# Patient Record
Sex: Female | Born: 2000
Health system: Southern US, Community
[De-identification: ages and names within clinical notes are randomized; demographics above are authoritative.]

## PROBLEM LIST (undated history)

## (undated) DIAGNOSIS — Z00129 Encounter for routine child health examination without abnormal findings: Principal | ICD-10-CM

## (undated) DIAGNOSIS — B084 Enteroviral vesicular stomatitis with exanthem: Secondary | ICD-10-CM

## (undated) DIAGNOSIS — T7840XA Allergy, unspecified, initial encounter: Secondary | ICD-10-CM

## (undated) DIAGNOSIS — L559 Sunburn, unspecified: Secondary | ICD-10-CM

## (undated) DIAGNOSIS — J45909 Unspecified asthma, uncomplicated: Secondary | ICD-10-CM

## (undated) DIAGNOSIS — D649 Anemia, unspecified: Secondary | ICD-10-CM

## (undated) DIAGNOSIS — F419 Anxiety disorder, unspecified: Secondary | ICD-10-CM

## (undated) HISTORY — PX: KIDNEY SURGERY: SHX687

## (undated) HISTORY — DX: Allergy, unspecified, initial encounter: T78.40XA

## (undated) HISTORY — DX: Anxiety disorder, unspecified: F41.9

## (undated) HISTORY — DX: Enteroviral vesicular stomatitis with exanthem: B08.4

## (undated) HISTORY — DX: Anemia, unspecified: D64.9

## (undated) HISTORY — PX: ENDOSCOPIC INJECTION OF DEFLUX: SHX1505

## (undated) HISTORY — DX: Encounter for routine child health examination without abnormal findings: Z00.129

## (undated) HISTORY — DX: Sunburn, unspecified: L55.9

## (undated) HISTORY — DX: Unspecified asthma, uncomplicated: J45.909

---
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
## 2022-01-05 DIAGNOSIS — O26893 Other specified pregnancy related conditions, third trimester: Secondary | ICD-10-CM | POA: Diagnosis not present

## 2022-01-05 DIAGNOSIS — N898 Other specified noninflammatory disorders of vagina: Secondary | ICD-10-CM | POA: Diagnosis not present

## 2022-01-05 DIAGNOSIS — Z3A35 35 weeks gestation of pregnancy: Secondary | ICD-10-CM | POA: Diagnosis not present

## 2022-01-21 DIAGNOSIS — O471 False labor at or after 37 completed weeks of gestation: Secondary | ICD-10-CM | POA: Diagnosis not present

## 2022-01-21 DIAGNOSIS — Z3A38 38 weeks gestation of pregnancy: Secondary | ICD-10-CM | POA: Diagnosis not present

## 2022-01-29 DIAGNOSIS — O9902 Anemia complicating childbirth: Secondary | ICD-10-CM | POA: Diagnosis not present

## 2022-01-29 DIAGNOSIS — Z91018 Allergy to other foods: Secondary | ICD-10-CM | POA: Diagnosis not present

## 2022-01-29 DIAGNOSIS — Z882 Allergy status to sulfonamides status: Secondary | ICD-10-CM | POA: Diagnosis not present

## 2022-01-29 DIAGNOSIS — Z3A39 39 weeks gestation of pregnancy: Secondary | ICD-10-CM | POA: Diagnosis not present

## 2022-01-29 DIAGNOSIS — Z880 Allergy status to penicillin: Secondary | ICD-10-CM | POA: Diagnosis not present

## 2022-04-17 ENCOUNTER — Encounter: Payer: Self-pay | Admitting: Family Medicine

## 2022-04-17 ENCOUNTER — Ambulatory Visit (INDEPENDENT_AMBULATORY_CARE_PROVIDER_SITE_OTHER): Payer: No Typology Code available for payment source | Admitting: Family Medicine

## 2022-04-17 VITALS — BP 123/84 | HR 74 | Ht 68.5 in | Wt 174.6 lb

## 2022-04-17 DIAGNOSIS — D509 Iron deficiency anemia, unspecified: Secondary | ICD-10-CM

## 2022-04-17 DIAGNOSIS — Z8279 Family history of other congenital malformations, deformations and chromosomal abnormalities: Secondary | ICD-10-CM | POA: Diagnosis not present

## 2022-04-17 NOTE — Patient Instructions (Signed)
Thank you for choosing [facility] for your Primary Care needs. I am excited for the opportunity to partner with you to meet your health care goals. It was a pleasure meeting you today! ? ? ?Information on diet, exercise, and health maintenance recommendations are listed below. This is information to help you be sure you are on track for optimal health and monitoring.  ? ?Please look over this and let us know if you have any questions or if you have completed any of the health maintenance outside of [facility] so that we can be sure your records are up to date.  ?___________________________________________________________ ? ?MyChart:  ?For all urgent or time sensitive needs we ask that you please call the office to avoid delays. Our number is [phone]. ?MyChart is not constantly monitored and due to the large volume of messages a day, replies may take up to 72 business hours. ? ?MyChart Policy: ?MyChart allows for you to see your visit notes, after visit summary, provider recommendations, lab and tests results, make an appointment, request refills, and contact your provider or the office for non-urgent questions or concerns. Providers are seeing patients during normal business hours and do not have built in time to review MyChart messages.  ?We ask that you allow a minimum of 3 business days for responses to MyChart messages. For this reason, please do not send urgent requests through MyChart. Please call the office at [phone]. ?New and ongoing conditions may require a visit. We have virtual and in-person visits available for your convenience.  ?Complex MyChart concerns may require a visit. Your provider may request you schedule a virtual or in-person visit to ensure we are providing the best care possible. ?MyChart messages sent after 11:00 AM on Friday will not be received by the provider until Monday morning.  ?  ?Lab and Test Results: ?You will receive your lab and  test results on MyChart as soon as they are completed and results have been sent by the lab or testing facility. Due to this service, you will receive your results BEFORE your provider.  ?I review lab and test results each morning prior to seeing patients. Some results require collaboration with other providers to ensure you are receiving the most appropriate care. For this reason, we ask that you please allow a minimum of 3-5 business days from the time that ALL results have been received for your provider to receive and review lab and test results and contact you about these.  ?Most lab and test result comments from the provider will be sent through MyChart. Your provider may recommend changes to the plan of care, follow-up visits, repeat testing, ask questions, or request an office visit to discuss these results. You may reply directly to this message or call the office to provide information for the provider or set up an appointment. ?In some instances, you will be called with test results and recommendations. Please let us know if this is preferred and we will make note of this in your chart to provide this for you.    ?If you have not heard a response to your lab or test results in 5 business days from all results returning to MyChart, please call the office to let us know. We ask that you please avoid calling prior to this time unless there is an emergent concern. Due to high call volumes, this can delay the resulting process. ? ?After Hours: ?For all non-emergency after hours needs,   please call the office at [phone] and select the option to reach the on-call  service. On-call services are shared between multiple [place] offices and therefore it will not be possible to speak directly with your provider. On-call providers may provide medical advice and recommendations, but are unable to provide refills for maintenance medications.  ?For all emergency or urgent medical needs after normal business  hours, we recommend that you seek care at the closest Urgent Care or Emergency Department to ensure appropriate treatment in a timely manner.  ?[facility] has a 24 hour emergency room located on the ground floor for your convenience.  ? ?Urgent Concerns During the Business Day ?Providers are seeing patients from 8AM to 5PM with a busy schedule and are most often not able to respond to non-urgent calls until the end of the day or the next business day. ?If you should have URGENT concerns during the day, please call and speak to the nurse or schedule a same day appointment so that we can address your concern without delay.  ? ?Thank you, again, for choosing me as your health care partner. I appreciate your trust and look forward to learning more about you.  ? ?Yarnell Arvidson B. Caris Cerveny, DNP, FNP-C ? ?___________________________________________________________ ? ?Health Maintenance Recommendations ?Screening Testing ?Mammogram ?Every 1-2 years based on history and risk factors ?Starting at age 50 ?Pap Smear ?Ages 21-39 every 3 years ?Ages 30-65 every 5 years with HPV testing ?More frequent testing may be required based on results and history ?Colon Cancer Screening ?Every 1-10 years based on test performed, risk factors, and history ?Starting at age 45 ?Bone Density Screening ?Every 2-10 years based on history ?Starting at age 65 for women ?Recommendations for men differ based on medication usage, history, and risk factors ?AAA Screening ?One time ultrasound ?Men 65-75 years old who have ever smoked ?Lung Cancer Screening ?Low Dose Lung CT every 12 months ?Age 50-80 years with a 20 pack-year smoking history who still smoke or who have quit within the last 15 years ? ?Screening Labs ?Routine  Labs: Complete Blood Count (CBC), Complete Metabolic Panel (CMP), Cholesterol (Lipid Panel) ?Every 6-12 months based on history and medications ?May be recommended more frequently based on current conditions or  previous results ?Hemoglobin A1c Lab ?Every 3-12 months based on history and previous results ?Starting at age 45 or earlier with diagnosis of diabetes, high cholesterol, BMI >26, and/or risk factors ?Frequent monitoring for patients with diabetes to ensure blood sugar control ?Thyroid Panel (TSH w/ T3 & T4) ?Every 6 months based on history, symptoms, and risk factors ?May be repeated more often if on medication ?HIV ?One time testing for all patients 13 and older ?May be repeated more frequently for patients with increased risk factors or exposure ?Hepatitis C ?One time testing for all patients 18 and older ?May be repeated more frequently for patients with increased risk factors or exposure ?Gonorrhea, Chlamydia ?Every 12 months for all sexually active persons 13-24 years ?Additional monitoring may be recommended for those who are considered high risk or who have symptoms ?PSA ?Men 40-54 years old with risk factors ?Additional screening may be recommended from age 55-69 based on risk factors, symptoms, and history ? ?Vaccine Recommendations ?Tetanus Booster ?All adults every 10 years ?Flu Vaccine ?All patients 6 months and older every year ?COVID Vaccine ?All patients 12 years and older ?Initial dosing with booster ?May recommend additional booster based on age and health history ?HPV Vaccine ?2 doses all patients   age 9-26 ?Dosing may be considered for patients over 26 ?Shingles Vaccine (Shingrix) ?2 doses all adults 50 years and older ?Pneumonia (Pneumovax 23) ?All adults 65 years and older ?May recommend earlier dosing based on health history ?Pneumonia (Prevnar 13) ?All adults 65 years and older ?Dosed 1 year after Pneumovax 23 ?Pneumonia (Prevnar 20) ?All adults 65 years and older (adults 19-64 with certain conditions or risk factors) ?1 dose  ?For those who have no received Prevnar 13 vaccine previously ? ? ?Additional Screening, Testing, and Vaccinations may be recommended on an individualized basis based on  family history, health history, risk factors, and/or exposure.  ?__________________________________________________________ ? ?Diet Recommendations for All Patients ? ?I recommend that all patients maintain a diet

## 2022-04-17 NOTE — Progress Notes (Signed)
? ?New Patient Office Visit ? ?Subjective   ? ?Patient ID: Erica Novak, female    DOB: [birth year]  Age: 21 y.o. MRN: [id number] ? ?CC: establish care ? ? ?HPI ?Erica Novak presents to establish care ? ?Just had a baby boy 11 weeks ago - doing really well overall. Lives with boyfriend and grandfather. 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   ? ? ?She was having some ear pain for about a week with history of perforated ear drum as a child, but has not had any problems recently. Gets an ear infection maybe once or twice a year. Symptoms have been fully resolved for the past 5+ days now. Would like me to check her ears today. ? ?Found out 2 weeks ago that grandfather's side of the family had a heart problem at birth (most relatives had heart surgery to correct the problem). Reports it was a "hole in the heart that never closed" - she says ASD sounds familiar. States she has never been told she had a murmur and has never had any cardiac/respiratory symptoms. States she does occasionally have episodes of lightheadedness, but thinks this may be related to her IDA. She wanted to see if this was anything she should worry about.  ? ?States she has mild IDA and has been recommended to take supplementation, but she has not been consistent because of the GI side effects of iron.  ? ? ? ?Outpatient Encounter Medications as of 04/17/2022  ?Medication Sig  ? [DISCONTINUED] norethindrone-ethinyl estradiol (LOESTRIN FE) 1-20 MG-MCG tablet Take 1 tablet by mouth daily.  ? [DISCONTINUED] omeprazole (PRILOSEC) 20 MG capsule Take 1 capsule (20 mg total) by mouth daily.  ? [DISCONTINUED] sucralfate (CARAFATE) 1 g tablet Take 1 tablet (1 g total) by mouth 4 (four) times daily as needed.  ? ?No facility-administered encounter medications on file as of 04/17/2022.  ? ? ?Past Medical History:  ?Diagnosis Date  ? Allergy   ? Anemia   ? Anxiety   ? Asthma, chronic 02/05/2016  ? Hand, foot and mouth disease   ? has  had in past  ? Sunburn 05/30/2012  ? [place] (well child check) 05/30/2012  ? ? ?Past Surgical History:  ?Procedure Laterality Date  ? ENDOSCOPIC INJECTION OF DEFLUX  21 yrs old  ? [place] SURGERY    ? [place]  ? ? ?Family History  ?Problem Relation Age of Onset  ? Hyperlipidemia Mother   ? Cancer Maternal Grandfather 33  ?     lung, brain, abdomen  ? Hypertension Maternal Grandfather   ? ? ?Social History  ? ?Socioeconomic History  ? Marital status: Single  ?  Spouse name: Not on file  ? Number of children: Not on file  ? Years of education: Not on file  ? Highest education level: Not on file  ?Occupational History  ? Not on file  ?Tobacco Use  ? Smoking status: Never  ? Smokeless tobacco: Never  ?Vaping Use  ? Vaping Use: Never used  ?Substance and Sexual Activity  ? Alcohol use: Yes  ?  Alcohol/week: 2.0 - 3.0 standard drinks  ?  Types: 2 - 3 Glasses of wine per week  ? Drug use: No  ? Sexual activity: Yes  ?  Birth control/protection: Condom  ?Other Topics Concern  ? Not on file  ?Social History Narrative  ? Not on file  ? ?Social Determinants of Health  ? ?Financial Resource Strain: Not on  file  ?Food Insecurity: Not on file  ?Transportation Needs: Not on file  ?Physical Activity: Not on file  ?Stress: Not on file  ?Social Connections: Not on file  ?Intimate Partner Violence: Not on file  ? ? ?ROS ?All review of systems negative except what is listed in the HPI ? ?  ? ? ?Objective   ? ?BP 123/84   Pulse 74   Ht 5' 8.5" (1.74 m)   Wt 174 lb 9.6 oz (79.2 kg)   LMP 04/30/2021   Breastfeeding No   BMI 26.16 kg/m?  ? ?Physical Exam ?Vitals reviewed.  ?Constitutional:   ?   Appearance: Normal appearance. She is normal weight.  ?HENT:  ?   Head: Normocephalic and atraumatic.  ?   Right Ear: Tympanic membrane normal.  ?   Left Ear: Tympanic membrane normal.  ?Cardiovascular:  ?   Rate and Rhythm: Normal rate and regular rhythm.  ?   Pulses: Normal pulses.  ?   Heart sounds: Normal heart sounds. No murmur heard. ?  No  friction rub.  ?Pulmonary:  ?   Effort: Pulmonary effort is normal.  ?   Breath sounds: Normal breath sounds.  ?Musculoskeletal:  ?   Right lower leg: No edema.  ?   Left lower leg: No edema.  ?Skin: ?   General: Skin is warm and dry.  ?Neurological:  ?   General: No focal deficit present.  ?   Mental Status: She is alert and oriented to person, place, and time. Mental status is at baseline.  ?Psychiatric:     ?   Mood and Affect: Mood normal.     ?   Behavior: Behavior normal.     ?   Thought Content: Thought content normal.     ?   Judgment: Judgment normal.  ? ? ? ? ? ? ?  ? ?Assessment & Plan:  ? ?1. Iron deficiency anemia, unspecified iron deficiency anemia type ?No symptoms currently other than occasional lightheadedness. Recommend she stay hydrated, eat a diet rich in iron, try to take supplementation. Checking labs today.  ?- CBC ?- IBC + Ferritin ? ?2. Family history of congenital heart defect ?Offered options of work up including ordering echo vs referring to cardiology. Declines any orders at this time - states she will think about it and let us know. Patient denies any chest pain, palpitations, dyspnea, wheezing, edema, recurrent headaches, vision changes, syncope.  ? ? ? ? ?Return in about 1 year (around 04/18/2023) for physical .  ? ?Terrilyn Saver, NP ? ? ?

## 2022-04-18 ENCOUNTER — Encounter: Payer: Self-pay | Admitting: Family Medicine

## 2022-04-18 DIAGNOSIS — Z8279 Family history of other congenital malformations, deformations and chromosomal abnormalities: Secondary | ICD-10-CM | POA: Insufficient documentation

## 2022-04-18 DIAGNOSIS — D509 Iron deficiency anemia, unspecified: Secondary | ICD-10-CM | POA: Insufficient documentation

## 2022-04-18 LAB — CBC
HCT: 38.6 % (ref 36.0–49.0)
Hemoglobin: 12.5 g/dL (ref 12.0–16.0)
MCHC: 32.5 g/dL (ref 31.0–37.0)
MCV: 85.3 fl (ref 78.0–98.0)
Platelets: 254 10*3/uL (ref 150.0–575.0)
RBC: 4.52 Mil/uL (ref 3.80–5.70)
RDW: 16.6 % — ABNORMAL HIGH (ref 11.4–15.5)
WBC: 7.9 10*3/uL (ref 4.5–13.5)

## 2022-04-18 LAB — IBC + FERRITIN
Ferritin: 8.7 ng/mL — ABNORMAL LOW (ref 10.0–291.0)
Iron: 33 ug/dL — ABNORMAL LOW (ref 42–145)
Saturation Ratios: 7.7 % — ABNORMAL LOW (ref 20.0–50.0)
TIBC: 428.4 ug/dL (ref 250.0–450.0)
Transferrin: 306 mg/dL (ref 212.0–360.0)

## 2022-06-04 ENCOUNTER — Encounter: Payer: Self-pay | Admitting: Family Medicine

## 2022-06-04 ENCOUNTER — Ambulatory Visit (INDEPENDENT_AMBULATORY_CARE_PROVIDER_SITE_OTHER): Payer: No Typology Code available for payment source | Admitting: Family Medicine

## 2022-06-04 VITALS — BP 117/63 | HR 94 | Ht 68.0 in | Wt 179.4 lb

## 2022-06-04 DIAGNOSIS — S61210A Laceration without foreign body of right index finger without damage to nail, initial encounter: Secondary | ICD-10-CM | POA: Diagnosis not present

## 2022-06-04 NOTE — Progress Notes (Signed)
   Acute Office Visit  Subjective:     Patient ID: AVERLEE SWARTZ, female    DOB: 08/29/2001, 21 y.o.   MRN: [id number]  CC: finger cut   HPI Patient is in today for cut to right index finger.  Patient reports that Friday evening she was washing a knife when it slipped and cut across her index finger at PIP. States it stopped bleeding relatively quick and she was able to clean it well and keep it covered. Pain has improved, but is still tender to the touch, mildly swollen and painful to bend. Reports most recent Tdap was last Fall during pregnancy. She denies any redness, warmth, drainage, numbness, tingling.    ROS All review of systems negative except what is listed in the HPI      Objective:    BP 117/63   Pulse 94   Ht 5\' 8"  (1.727 m)   Wt 179 lb 6.4 oz (81.4 kg)   BMI 27.28 kg/m    Physical Exam Vitals reviewed.  Constitutional:      Appearance: Normal appearance.  Musculoskeletal:     Comments: Right 1st finger PIP with mild edema, and healing laceration. No erythema, warmth, drainage (see picture), impaired flexion due to pain/edema, normal sensation and cap refill   Skin:    General: Skin is warm and dry.     Findings: No erythema.  Neurological:     General: No focal deficit present.     Mental Status: She is alert and oriented to person, place, and time. Mental status is at baseline.  Psychiatric:        Mood and Affect: Mood normal.        Behavior: Behavior normal.        Thought Content: Thought content normal.        Judgment: Judgment normal.        No results found for any visits on 06/04/22.      Assessment & Plan:   1. Laceration of right index finger without foreign body without damage to nail, initial encounter No alarm findings on exam. Laceration healing nicely. No signs of infection. Recommend ice, NSAIDs, rest for inflammation/pain followed by ROM stretches as tolerated. Patient aware of signs/symptoms requiring further/urgent  evaluation.    Return if symptoms worsen or fail to improve.  08/05/22, NP

## 2022-06-22 ENCOUNTER — Other Ambulatory Visit (HOSPITAL_BASED_OUTPATIENT_CLINIC_OR_DEPARTMENT_OTHER): Payer: Self-pay

## 2022-06-22 ENCOUNTER — Ambulatory Visit (INDEPENDENT_AMBULATORY_CARE_PROVIDER_SITE_OTHER): Payer: No Typology Code available for payment source | Admitting: Family

## 2022-06-22 VITALS — BP 112/72 | HR 97 | Temp 97.8°F | Resp 18 | Ht 69.0 in | Wt 175.6 lb

## 2022-06-22 DIAGNOSIS — L309 Dermatitis, unspecified: Secondary | ICD-10-CM

## 2022-06-22 MED ORDER — TRIAMCINOLONE ACETONIDE 0.1 % EX CREA
1.0000 | TOPICAL_CREAM | Freq: Two times a day (BID) | CUTANEOUS | 0 refills | Status: DC
  Filled 2022-06-22: qty 30, 15d supply, fill #0

## 2022-06-22 MED ORDER — DOXYCYCLINE HYCLATE 100 MG PO TABS
100.0000 mg | ORAL_TABLET | Freq: Two times a day (BID) | ORAL | 0 refills | Status: DC
  Filled 2022-06-22: qty 14, 7d supply, fill #0

## 2022-06-22 NOTE — Progress Notes (Signed)
  Erica Novak is a 21 y.o. female with the following history as recorded in [facility]:  Patient Active Problem List   Diagnosis Date Noted   Iron deficiency anemia 04/18/2022   Family history of congenital heart defect 04/18/2022   Abdominal pain 04/21/2021   Bug bite of finger 08/14/2018   Acute bronchitis 11/06/2016   Asthma, chronic 02/05/2016   Allergic rhinitis 01/28/2015   WCC (well child check) 05/30/2012   Bilateral ureteral reflux 05/30/2012    Current Outpatient Medications  Medication Sig Dispense Refill   doxycycline (VIBRA-TABS) 100 MG tablet Take 1 tablet (100 mg total) by mouth 2 (two) times daily. 14 tablet 0   triamcinolone cream (KENALOG) 0.1 % Apply 1 application topically 2 (two) times daily. 30 g 0   No current facility-administered medications for this visit.    Allergies: Pear, Penicillins, and Sulfa antibiotics  Past Medical History:  Diagnosis Date   Allergy    Anemia    Anxiety    Asthma, chronic 02/05/2016   Hand, foot and mouth disease    has had in past   Sunburn 05/30/2012   WCC (well child check) 05/30/2012    Past Surgical History:  Procedure Laterality Date   ENDOSCOPIC INJECTION OF DEFLUX  21 yrs old   [place] SURGERY     in [place]    Family History  Problem Relation Age of Onset   Hyperlipidemia Mother    Cancer Maternal Grandfather 21       lung, brain, abdomen   Hypertension Maternal Grandfather     Social History   Tobacco Use   Smoking status: Never   Smokeless tobacco: Never  Substance Use Topics   Alcohol use: Yes    Alcohol/week: 2.0 - 3.0 standard drinks of alcohol    Types: 2 - 3 Glasses of wine per week    Subjective:  Patient presents with concerns for possible spider bite; injury occurred on left lower leg; no concerns for tick bite; is concerned that area of redness around the bite is spreading; no OTC medications;   LMP- early July/ not breast feeding     Objective:  Vitals:   06/22/22 1307  BP:  112/72  Pulse: 97  Resp: 18  Temp: 97.8 F (36.6 C)  TempSrc: Temporal  SpO2: 98%  Weight: 175 lb 9.6 oz (79.7 kg)  Height: 5\' 9"  (1.753 m)    General: Well developed, well nourished, in no acute distress  Skin : Warm and dry. Puncture wound noted on inner left calf with surrounding erythema Head: Normocephalic and atraumatic  Lungs: Respirations unlabored;  Neurologic: Alert and oriented; speech intact; face symmetrical; moves all extremities well; CNII-XII intact without focal deficit   Assessment:  1. Dermatitis     Plan:  Secondary to insect bite; Rx for Doxycycline 100 mg bid x 5-7 days; Rx for Triamcinolone bid; follow up worse, no better.   No follow-ups on file.  No orders of the defined types were placed in this encounter.   Requested Prescriptions   Signed Prescriptions Disp Refills   doxycycline (VIBRA-TABS) 100 MG tablet 14 tablet 0    Sig: Take 1 tablet (100 mg total) by mouth 2 (two) times daily.   triamcinolone cream (KENALOG) 0.1 % 30 g 0    Sig: Apply 1 application topically 2 (two) times daily.

## 2022-06-26 ENCOUNTER — Ambulatory Visit (INDEPENDENT_AMBULATORY_CARE_PROVIDER_SITE_OTHER): Payer: No Typology Code available for payment source | Admitting: Family Medicine

## 2022-06-26 ENCOUNTER — Ambulatory Visit (HOSPITAL_BASED_OUTPATIENT_CLINIC_OR_DEPARTMENT_OTHER)
Admission: RE | Admit: 2022-06-26 | Discharge: 2022-06-26 | Disposition: A | Discharge status: Home / Self Care | Payer: No Typology Code available for payment source | Source: Ambulatory Visit | Attending: Family Medicine | Admitting: Family Medicine

## 2022-06-26 ENCOUNTER — Encounter: Payer: Self-pay | Admitting: Family Medicine

## 2022-06-26 VITALS — BP 121/70 | HR 87 | Ht 69.0 in | Wt 179.8 lb

## 2022-06-26 DIAGNOSIS — S61210D Laceration without foreign body of right index finger without damage to nail, subsequent encounter: Secondary | ICD-10-CM | POA: Insufficient documentation

## 2022-06-26 NOTE — Progress Notes (Signed)
   Acute Office Visit  Subjective:     Patient ID: Erica Novak, female    DOB: 2001/10/09, 21 y.o.   MRN: [id number]  CC: finger pain   HPI Patient is in today for follow-up on right index finger injury.  Patient was seen 06/04/22, a few days after laceration to right index PIP while cleaning a knife.   Laceration has healed nicely, no signs of infection. However, she is still feeling a knot/deformity in the joint, has occasional throbbing, and is unable to fully bend at the join.      ROS All review of systems negative except what is listed in the HPI      Objective:    BP 121/70   Pulse 87   Ht 5\' 9"  (1.753 m)   Wt 179 lb 12.8 oz (81.6 kg)   BMI 26.55 kg/m    Physical Exam Vitals reviewed.  Constitutional:      Appearance: Normal appearance.  Musculoskeletal:        General: Swelling and deformity present.     Comments: Right index PIP with mild edema, palpable knot/deformity, unable to fully bend  Skin:    General: Skin is warm and dry.     Findings: No bruising or erythema.  Neurological:     General: No focal deficit present.     Mental Status: She is alert and oriented to person, place, and time. Mental status is at baseline.  Psychiatric:        Mood and Affect: Mood normal.        Behavior: Behavior normal.        Thought Content: Thought content normal.        Judgment: Judgment normal.     No results found for any visits on 06/26/22.      Assessment & Plan:   Problem List Items Addressed This Visit   None Visit Diagnoses     Laceration of right index finger without foreign body without damage to nail, subsequent encounter    -  Primary No signs of infection.  Xray today.  Continue supportive measures for now. Will update with results.  Patient aware of signs/symptoms requiring further/urgent evaluation.    Relevant Orders   DG Finger Index Right       No orders of the defined types were placed in this encounter.   Return  if symptoms worsen or fail to improve.  06/28/22, NP

## 2022-06-27 ENCOUNTER — Encounter: Payer: Self-pay | Admitting: Family Medicine

## 2022-06-28 ENCOUNTER — Encounter: Payer: Self-pay | Admitting: Family Medicine

## 2022-06-28 DIAGNOSIS — M79644 Pain in right finger(s): Secondary | ICD-10-CM

## 2022-07-10 ENCOUNTER — Ambulatory Visit (INDEPENDENT_AMBULATORY_CARE_PROVIDER_SITE_OTHER): Payer: No Typology Code available for payment source | Admitting: Family Medicine

## 2022-07-10 ENCOUNTER — Encounter: Payer: Self-pay | Admitting: Family Medicine

## 2022-07-10 ENCOUNTER — Ambulatory Visit: Payer: Self-pay

## 2022-07-10 VITALS — BP 112/75 | Ht 69.0 in | Wt 179.0 lb

## 2022-07-10 DIAGNOSIS — M79644 Pain in right finger(s): Secondary | ICD-10-CM

## 2022-07-10 DIAGNOSIS — S6991XA Unspecified injury of right wrist, hand and finger(s), initial encounter: Secondary | ICD-10-CM

## 2022-07-10 NOTE — Assessment & Plan Note (Signed)
Acutely occurring.  Initial injury on June 30.  A knife cut her PIP joint.  The exterior surface has healed but now has changes of the joint to represent ongoing damage. -Counseled on home exercise therapy and supportive care. -Counseled on buddy taping. -MR arthrogram of the right finger of the PIP joint to evaluate for collateral ligament rupture and for presurgical planning.

## 2022-07-10 NOTE — Patient Instructions (Signed)
Nice to meet you Please try the buddy taping or wrapping the joint We'll get the MRI at [facility] imaging   Please send me a message in MyChart with any questions or updates.  We'll follow up after we get the results back from the MRI .   --Dr. Jordan Likes

## 2022-07-10 NOTE — Progress Notes (Signed)
  Erica Novak - 21 y.o. female MRN [id number]  Date of birth: 03-27-2001  SUBJECTIVE:  Including CC & ROS.  No chief complaint on file.   Erica Novak is a 21 y.o. female that is presenting with right index finger pain.  She had a laceration from a knife on June 30.  The wound healed but now has limited range of motion and ongoing pain of the PIP joint.  Has limited flexion.  Has instability of the joint.  Independent review of the right index finger from 7/25 shows no acute changes.  Review of Systems See HPI   HISTORY: Past Medical, Surgical, Social, and Family History Reviewed & Updated per EMR.   Pertinent Historical Findings include:  Past Medical History:  Diagnosis Date   Allergy    Anemia    Anxiety    Asthma, chronic 02/05/2016   Hand, foot and mouth disease    has had in past   Sunburn 05/30/2012   WCC (well child check) 05/30/2012    Past Surgical History:  Procedure Laterality Date   ENDOSCOPIC INJECTION OF DEFLUX  21 yrs old   KIDNEY SURGERY     in [place]     PHYSICAL EXAM:  VS: BP 112/75 (BP Location: Right Arm, Patient Position: Sitting)   Ht 5\' 9"  (1.753 m)   Wt 179 lb (81.2 kg)   BMI 26.43 kg/m  Physical Exam Gen: NAD, alert, cooperative with exam, well-appearing MSK:  Neurovascularly intact    Limited ultrasound: Right index finger:  Normal-appearing MCP joint. There is a hypoechoic change on the radial aspect of the PIP joint.  This appears to have a rupture of the collateral ligament with associated effusion.  Summary: Collateral ligament rupture of the PIP joint  Ultrasound and interpretation by , MD    ASSESSMENT & PLAN:   Injury of collateral ligament of finger of right hand Acutely occurring.  Initial injury on June 30.  A knife cut her PIP joint.  The exterior surface has healed but now has changes of the joint to represent ongoing damage. -Counseled on home exercise therapy and supportive care. -Counseled  on buddy taping. -MR arthrogram of the right finger of the PIP joint to evaluate for collateral ligament rupture and for presurgical planning.

## 2022-07-12 ENCOUNTER — Other Ambulatory Visit: Payer: Self-pay | Admitting: Family Medicine

## 2022-07-12 DIAGNOSIS — S6991XA Unspecified injury of right wrist, hand and finger(s), initial encounter: Secondary | ICD-10-CM

## 2022-07-17 ENCOUNTER — Encounter: Payer: Self-pay | Admitting: Family Medicine

## 2022-07-30 ENCOUNTER — Ambulatory Visit
Admission: RE | Admit: 2022-07-30 | Discharge: 2022-07-30 | Disposition: A | Discharge status: Home / Self Care | Payer: No Typology Code available for payment source | Source: Ambulatory Visit | Attending: Family Medicine | Admitting: Family Medicine

## 2022-07-30 DIAGNOSIS — S6991XA Unspecified injury of right wrist, hand and finger(s), initial encounter: Secondary | ICD-10-CM

## 2022-07-30 MED ORDER — IOPAMIDOL (ISOVUE-M 200) INJECTION 41%
1.0000 mL | Freq: Once | INTRAMUSCULAR | Status: AC
  Administered 2022-07-30: 1 mL via INTRA_ARTICULAR

## 2022-08-01 ENCOUNTER — Encounter: Payer: Self-pay | Admitting: Family Medicine

## 2022-08-01 ENCOUNTER — Telehealth (INDEPENDENT_AMBULATORY_CARE_PROVIDER_SITE_OTHER): Payer: No Typology Code available for payment source | Admitting: Family Medicine

## 2022-08-01 DIAGNOSIS — S6991XD Unspecified injury of right wrist, hand and finger(s), subsequent encounter: Secondary | ICD-10-CM

## 2022-08-01 NOTE — Assessment & Plan Note (Signed)
MRi was showing a change but read as normal. Her pain has gotten worse and having limited range of motion of the index PIP joint.  - counseled on home exercise therapy and supportive care.  - referral to hand surgeon

## 2022-08-01 NOTE — Progress Notes (Signed)
Virtual Visit via Video Note  I connected with Erica Novak on 08/01/22 at  1:10 PM EDT by a video enabled telemedicine application and verified that I am speaking with the correct person using two identifiers.  Location: Patient: home Provider: office   I discussed the limitations of evaluation and management by telemedicine and the availability of in person appointments. The patient expressed understanding and agreed to proceed.  History of Present Illness:  Ms. Erica Novak is a 21 yo F that is following up after the MRI of her right index finger. This was showing a change of the ulnar collateral ligament even as it was read as normal.   Observations/Objective:   Assessment and Plan:  Injury of collateral ligament of right index finger:  MRi was showing a change but read as normal. Her pain has gotten worse and having limited range of motion of the index PIP joint.  - counseled on home exercise therapy and supportive care.  - referral to hand surgeon  Follow Up Instructions:    I discussed the assessment and treatment plan with the patient. The patient was provided an opportunity to ask questions and all were answered. The patient agreed with the plan and demonstrated an understanding of the instructions.   The patient was advised to call back or seek an in-person evaluation if the symptoms worsen or if the condition fails to improve as anticipated.    Clare Gandy, MD

## 2022-10-17 ENCOUNTER — Telehealth: Payer: Self-pay | Admitting: Family Medicine

## 2022-10-17 NOTE — Telephone Encounter (Signed)
Pt called stating she was experiencing the following symptoms:  -"Feeling" heartbeat in her chest  -SOB during these episodes  -Has gotten worse over time  -Happens every five minutes  Pt was transferred to triage nurse for further eval.

## 2022-10-17 NOTE — Telephone Encounter (Signed)
Nurse Assessment Nurse: Elesa Hacker, RN, Nash Dimmer Date/Time Lamount Cohen Time): 10/17/2022 10:54:20 AM Confirm and document reason for call. If symptomatic, describe symptoms. ---Caller states her family has a history with heart issues. Caller states she feels light headed. Caller states she has moments when her heart is not beating as noticeable and then it will beat really fast and cause a headache. Caller states she get a sharp pain in her ribs when that happens as well. Does the patient have any new or worsening symptoms? ---Yes Will a triage be completed? ---Yes Related visit to physician within the last 2 weeks? ---No Does the PT have any chronic conditions? (i.e. diabetes, asthma, this includes High risk factors for pregnancy, etc.) ---Yes List chronic conditions. ---Asthma Is the patient pregnant or possibly pregnant? (Ask all females between the ages of 30-55) ---No Is this a behavioral health or substance abuse call? ---No PLEASE NOTE: All timestamps contained within this report are represented as [place] Standard Time. CONFIDENTIALTY NOTICE: This fax transmission is intended only for the addressee. It contains information that is legally privileged, confidential or otherwise protected from use or disclosure. If you are not the intended recipient, you are strictly prohibited from reviewing, disclosing, copying using or disseminating any of this information or taking any action in reliance on or regarding this information. If you have received this fax in error, please notify us immediately by telephone so that we can arrange for its return to [place]. Phone: [phone], Toll-Free: [phone], Fax: [phone] Page: 2 of 2 Call Id: [id number] Guidelines Guideline Title Affirmed Question Affirmed Notes Nurse Date/Time Lamount Cohen Time) Heart Rate and Heart Beat Questions Dizziness, lightheaded, feels like going to faint Elesa Hacker, RN, Nash Dimmer 10/17/2022 10:55:21 AM Disp. Time Lamount Cohen Time)  Disposition Final User 10/17/2022 10:59:39 AM Go to ED Now (or PCP triage) Yes Deaton, RN, Nash Dimmer Final Disposition 10/17/2022 10:59:39 AM Go to ED Now (or PCP triage) Yes Deaton, RN, Cory Roughen Disagree/Comply Comply Caller Understands Yes PreDisposition Did not know what to do Care Advice Given Per Guideline GO TO ED/UCC NOW (OR PCP TRIAGE): * IF NO PCP (PRIMARY CARE PROVIDER) SECOND-LEVEL TRIAGE: Your child needs to be seen within the next hour. Go to the ED/UCC at _____________ Hospital. Leave as soon as you can. CARE ADVICE given per Heart Rate and Heart Beat Questions (Pediatric) guideline. * Lie down until dizziness passes. * Offer several glasses of water. FLUIDS - OFFER MORE: * UCC IS OPEN: Some Urgent Care Centers (UCCs) can manage patients who are stable and have less serious symptoms (e.g., minor illnesses and injuries). The triager must know the [facility] capabilities before sending a patient there. If unsure, call ahead. * ED: Patients who may need surgery, need hospitalization, sound seriously ill or may be unstable need to be sent to an ED. Likewise, so do most patients with complex medical problems and serious symptoms. Comments User: Erica Scot, RN Date/Time Lamount Cohen Time): 10/17/2022 11:01:32 AM Caller advised that she is at work and that she is feeling like her heart is beating very fast, states that she is also dizzy. Advised that we need to get her seen within the hour for her sx. Caller states that she is at work, advised that someone at work should drive her. She is going to notify her boss. Referrals GO TO FACILITY UNDECIDED GO TO FACILITY OTHER - SPECIFY

## 2022-10-24 ENCOUNTER — Encounter: Payer: Self-pay | Admitting: Family Medicine

## 2022-10-24 ENCOUNTER — Ambulatory Visit (INDEPENDENT_AMBULATORY_CARE_PROVIDER_SITE_OTHER): Payer: No Typology Code available for payment source | Admitting: Family Medicine

## 2022-10-24 ENCOUNTER — Ambulatory Visit: Payer: No Typology Code available for payment source | Attending: Family Medicine

## 2022-10-24 VITALS — BP 120/75 | HR 93 | Ht 69.0 in | Wt 159.0 lb

## 2022-10-24 DIAGNOSIS — R002 Palpitations: Secondary | ICD-10-CM | POA: Diagnosis not present

## 2022-10-24 NOTE — Progress Notes (Signed)
   Acute Office Visit  Subjective:     Patient ID: Erica Novak, female    DOB: [birth year], 21 y.o.   MRN: [id number]  CC: palpitations    HPI Patient is in today for palpitations   PALPITATIONS Duration: 4-5 weeks Symptom description: occasional poudning, fast heart rate (15-30 seconds), cluster headache for 5 seconds, hand tingling (lingering up to an hour +)  Duration of episode:  varies, anywhere from a few seconds to a few minutes Frequency:  new within the last few weeks, but has 10+ episodes per day  Activity when event occurred:  rest, exercise, work Related to exertion: no Dyspnea:  occasional Chest pain: no Syncope:  none, has felt presyncopal a few times Anxiety/stress: no Nausea/vomiting: no Diaphoresis: no Coronary artery disease: no Congestive heart failure: no Arrhythmia:no Thyroid disease: no Caffeine intake:  used to average 8 Red Bulls a day prior to pregnancy (44-month old), then cut down to 2-3 per day, no weaned down to 2-3 times a week of 8oz [employer]; hasn't noticed correlation to the timing of caffeine intake  Status:  better Treatments attempted:none  Reports paternal grandfather with cardiac issues.    ROS All review of systems negative except what is listed in the HPI      Objective:    BP 120/75   Pulse 93   Ht 5\' 9"  (1.753 m)   Wt 159 lb (72.1 kg)   BMI 23.48 kg/m    Physical Exam Vitals reviewed.  Constitutional:      Appearance: Normal appearance.  Cardiovascular:     Rate and Rhythm: Normal rate and regular rhythm.     Pulses: Normal pulses.     Heart sounds: Normal heart sounds. No murmur heard. Pulmonary:     Effort: Pulmonary effort is normal.     Breath sounds: Normal breath sounds.  Musculoskeletal:     Cervical back: Normal range of motion and neck supple. No tenderness.  Lymphadenopathy:     Cervical: No cervical adenopathy.  Skin:    General: Skin is warm and dry.  Neurological:     Mental Status: She  is alert and oriented to person, place, and time.  Psychiatric:        Mood and Affect: Mood normal.        Behavior: Behavior normal.        Thought Content: Thought content normal.        Judgment: Judgment normal.         No results found for any visits on 10/24/22.      Assessment & Plan:   Problem List Items Addressed This Visit   None Visit Diagnoses     Palpitations    -  Primary EKG NSR 92 Labs today Echo and heart monitor ordered Stay hydrated Avoid caffeine Get plenty of sleep  Patient aware of signs/symptoms requiring further/urgent evaluation.     Relevant Orders   EKG 12-Lead (Completed)   ECHOCARDIOGRAM COMPLETE   LONG TERM MONITOR (3-14 DAYS)   CBC   Comprehensive metabolic panel   T4, free   TSH   Iron, TIBC and Ferritin Panel   Ferritin       No orders of the defined types were placed in this encounter.    Return if symptoms worsen or fail to improve.  10/26/22, NP

## 2022-10-24 NOTE — Patient Instructions (Signed)
Labs today Echo and heart monitor ordered Stay hydrated Avoid caffeine Get plenty of sleep

## 2022-10-24 NOTE — Progress Notes (Unsigned)
Enrolled for [employer] to mail a ZIO XT long term holter monitor to the patients address on file.   DOD to read. 

## 2022-10-25 LAB — COMPREHENSIVE METABOLIC PANEL
AG Ratio: 2.1 (calc) (ref 1.0–2.5)
ALT: 12 U/L (ref 5–32)
AST: 15 U/L (ref 12–32)
Albumin: 5.1 g/dL (ref 3.6–5.1)
Alkaline phosphatase (APISO): 66 U/L (ref 36–128)
BUN: 11 mg/dL (ref 7–20)
CO2: 28 mmol/L (ref 20–32)
Calcium: 10.3 mg/dL (ref 8.9–10.4)
Chloride: 103 mmol/L (ref 98–110)
Creat: 0.92 mg/dL (ref 0.50–0.96)
Globulin: 2.4 g/dL (calc) (ref 2.0–3.8)
Glucose, Bld: 84 mg/dL (ref 65–99)
Potassium: 4.8 mmol/L (ref 3.8–5.1)
Sodium: 140 mmol/L (ref 135–146)
Total Bilirubin: 0.5 mg/dL (ref 0.2–1.1)
Total Protein: 7.5 g/dL (ref 6.3–8.2)

## 2022-10-25 LAB — CBC
HCT: 42.1 % (ref 35.0–45.0)
Hemoglobin: 14.3 g/dL (ref 11.7–15.5)
MCH: 30.4 pg (ref 27.0–33.0)
MCHC: 34 g/dL (ref 32.0–36.0)
MCV: 89.4 fL (ref 80.0–100.0)
MPV: 10.9 fL (ref 7.5–12.5)
Platelets: 280 10*3/uL (ref 140–400)
RBC: 4.71 10*6/uL (ref 3.80–5.10)
RDW: 12.4 % (ref 11.0–15.0)
WBC: 7.1 10*3/uL (ref 3.8–10.8)

## 2022-10-25 LAB — T4, FREE: Free T4: 1.2 ng/dL (ref 0.8–1.4)

## 2022-10-25 LAB — IRON,TIBC AND FERRITIN PANEL
%SAT: 19 % (calc) (ref 15–45)
Ferritin: 11 ng/mL — ABNORMAL LOW (ref 16–154)
Iron: 70 ug/dL (ref 27–164)
TIBC: 372 mcg/dL (calc) (ref 271–448)

## 2022-10-25 LAB — TSH: TSH: 0.92 mIU/L

## 2022-10-29 DIAGNOSIS — R002 Palpitations: Secondary | ICD-10-CM

## 2022-11-05 ENCOUNTER — Telehealth: Payer: Self-pay | Admitting: Family Medicine

## 2022-11-05 NOTE — Telephone Encounter (Signed)
Pt called stating that she needed a more detailed note to explain the reason why she needs to wear her apple watch at work and the duration of how long this is going to be for. Please Advise.

## 2022-11-27 DIAGNOSIS — J029 Acute pharyngitis, unspecified: Secondary | ICD-10-CM | POA: Diagnosis not present

## 2022-11-27 DIAGNOSIS — J039 Acute tonsillitis, unspecified: Secondary | ICD-10-CM | POA: Diagnosis not present

## 2022-11-28 ENCOUNTER — Ambulatory Visit: Payer: No Typology Code available for payment source | Admitting: Family Medicine

## 2022-12-26 ENCOUNTER — Other Ambulatory Visit: Payer: Self-pay | Admitting: Family Medicine

## 2022-12-26 DIAGNOSIS — R002 Palpitations: Secondary | ICD-10-CM

## 2023-01-09 ENCOUNTER — Encounter: Payer: Self-pay | Admitting: *Deleted

## 2023-03-08 ENCOUNTER — Telehealth: Payer: Self-pay | Admitting: Family Medicine

## 2023-03-08 NOTE — Telephone Encounter (Signed)
Echo order was placed in November. Printed order and faxed to number provided with confirmation received.

## 2023-03-08 NOTE — Telephone Encounter (Signed)
Pt has appt in the next hour so she wanted to know if the echocardiogram order could be faxed to them. See fax number in previous message.

## 2023-03-08 NOTE — Telephone Encounter (Signed)
Erica Novak from [facility] was asking if we could fax an echocardiogram order to [phone].

## 2023-03-13 ENCOUNTER — Other Ambulatory Visit (HOSPITAL_BASED_OUTPATIENT_CLINIC_OR_DEPARTMENT_OTHER): Payer: Self-pay

## 2023-03-13 ENCOUNTER — Ambulatory Visit (INDEPENDENT_AMBULATORY_CARE_PROVIDER_SITE_OTHER): Payer: No Typology Code available for payment source | Admitting: Family Medicine

## 2023-03-13 VITALS — BP 125/73 | HR 86 | Temp 98.3°F | Ht 69.0 in | Wt 156.0 lb

## 2023-03-13 DIAGNOSIS — L309 Dermatitis, unspecified: Secondary | ICD-10-CM

## 2023-03-13 DIAGNOSIS — J302 Other seasonal allergic rhinitis: Secondary | ICD-10-CM

## 2023-03-13 MED ORDER — FLUTICASONE PROPIONATE 50 MCG/ACT NA SUSP
2.0000 | Freq: Every day | NASAL | 6 refills | Status: DC
  Filled 2023-03-13: qty 16, 30d supply, fill #0
  Filled 2023-04-11: qty 16, 30d supply, fill #1
  Filled 2023-09-16: qty 16, 30d supply, fill #2

## 2023-03-13 MED ORDER — LEVOCETIRIZINE DIHYDROCHLORIDE 5 MG PO TABS
5.0000 mg | ORAL_TABLET | Freq: Every evening | ORAL | 1 refills | Status: DC
  Filled 2023-03-13: qty 30, 30d supply, fill #0
  Filled 2023-04-11: qty 30, 30d supply, fill #1
  Filled 2023-09-16: qty 30, 30d supply, fill #2

## 2023-03-13 MED ORDER — TRIAMCINOLONE ACETONIDE 0.1 % EX CREA
1.0000 | TOPICAL_CREAM | Freq: Two times a day (BID) | CUTANEOUS | 3 refills | Status: DC
  Filled 2023-03-13: qty 30, 28d supply, fill #0
  Filled 2023-04-11: qty 30, 28d supply, fill #1
  Filled 2023-09-16: qty 30, 28d supply, fill #2

## 2023-03-13 MED ORDER — AZELASTINE HCL 0.1 % NA SOLN
2.0000 | Freq: Two times a day (BID) | NASAL | 12 refills | Status: DC
  Filled 2023-03-13: qty 30, 34d supply, fill #0
  Filled 2023-04-11: qty 30, 34d supply, fill #1
  Filled 2023-09-16: qty 30, 34d supply, fill #2

## 2023-03-13 NOTE — Progress Notes (Signed)
Acute Office Visit  Subjective:     Patient ID: Erica Novak, female    DOB: 16-Jul-2001, 22 y.o.   MRN: [id number]  Chief Complaint  Patient presents with   Allergies    HPI Patient is in today for allergies.  Patient with 3-4 days of allergic rhinitis symptoms after playing outside earlier this week - rhinorrhea, itchy throat, itchy ears, itchy/watery eyes, occasional nasal congestion. No fevers, chills, body aches, nausea, vomiting, diarrhea, chest pain, dyspnea, fatigue. So far she tried one dose of children's Zyrtec, but it didn't do anything. No known sick contacts. She had a negative COVID test yesterday.   Eczema to right arm has also been flaring up. Ran out of steroid cream, requesting refill.   ROS All review of systems negative except what is listed in the HPI      Objective:    BP 125/73   Pulse 86   Temp 98.3 F (36.8 C) (Oral)   Ht 5\' 9"  (1.753 m)   Wt 156 lb (70.8 kg)   SpO2 100%   BMI 23.04 kg/m    Physical Exam Vitals reviewed.  Constitutional:      Appearance: Normal appearance.  HENT:     Head: Normocephalic and atraumatic.     Right Ear: Tympanic membrane normal.     Left Ear: Tympanic membrane normal.     Nose: Rhinorrhea present.     Mouth/Throat:     Mouth: Mucous membranes are moist.     Pharynx: Oropharynx is clear.     Comments: PND/cobblestoning Eyes:     Conjunctiva/sclera: Conjunctivae normal.  Cardiovascular:     Rate and Rhythm: Normal rate and regular rhythm.     Pulses: Normal pulses.     Heart sounds: Normal heart sounds.  Pulmonary:     Effort: Pulmonary effort is normal.     Breath sounds: Normal breath sounds.  Musculoskeletal:     Cervical back: Normal range of motion and neck supple.  Skin:    General: Skin is warm and dry.  Neurological:     Mental Status: She is alert and oriented to person, place, and time.  Psychiatric:        Mood and Affect: Mood normal.        Behavior: Behavior normal.         Thought Content: Thought content normal.        Judgment: Judgment normal.     No results found for any visits on 03/13/23.      Assessment & Plan:   Problem List Items Addressed This Visit     Allergic rhinitis - Primary Stable today. Adding Xyzal, Flonase, Astelin. Stay well hydrated.     Relevant Medications   levocetirizine (XYZAL) 5 MG tablet   fluticasone (FLONASE) 50 MCG/ACT nasal spray   azelastine (ASTELIN) 0.1 % nasal spray   Other Visit Diagnoses     Eczema, unspecified type     Stable today. Refilling steroid cream.   Relevant Medications   triamcinolone cream (KENALOG) 0.1 %       Meds ordered this encounter  Medications   levocetirizine (XYZAL) 5 MG tablet    Sig: Take 1 tablet (5 mg total) by mouth every evening.    Dispense:  90 tablet    Refill:  1    Order Specific Question:   Supervising Provider    Answer:   Danise Edge A [[id number]]   fluticasone (FLONASE) 50 MCG/ACT nasal spray  Sig: Place 2 sprays into both nostrils daily.    Dispense:  16 g    Refill:  6    Order Specific Question:   Supervising Provider    Answer:   Danise Edge A [[id number]]   azelastine (ASTELIN) 0.1 % nasal spray    Sig: Place 2 sprays into both nostrils 2 (two) times daily. Use in each nostril as directed    Dispense:  30 mL    Refill:  12    Order Specific Question:   Supervising Provider    Answer:   Danise Edge A [[id number]]   triamcinolone cream (KENALOG) 0.1 %    Sig: Apply 1 application topically 2 (two) times daily.    Dispense:  30 g    Refill:  3    Order Specific Question:   Supervising Provider    Answer:   Danise Edge A [[id number]]    Return if symptoms worsen or fail to improve.  Clayborne Dana, NP

## 2023-03-18 ENCOUNTER — Encounter: Payer: Self-pay | Admitting: *Deleted

## 2023-03-28 DIAGNOSIS — Z3200 Encounter for pregnancy test, result unknown: Secondary | ICD-10-CM | POA: Diagnosis not present

## 2023-03-28 DIAGNOSIS — R110 Nausea: Secondary | ICD-10-CM | POA: Diagnosis not present

## 2023-04-22 ENCOUNTER — Ambulatory Visit (INDEPENDENT_AMBULATORY_CARE_PROVIDER_SITE_OTHER): Payer: No Typology Code available for payment source | Admitting: Family Medicine

## 2023-04-22 ENCOUNTER — Encounter: Payer: Self-pay | Admitting: Family Medicine

## 2023-04-22 VITALS — BP 124/85 | HR 92 | Ht 69.0 in | Wt 154.0 lb

## 2023-04-22 DIAGNOSIS — Z0000 Encounter for general adult medical examination without abnormal findings: Secondary | ICD-10-CM | POA: Diagnosis not present

## 2023-04-22 DIAGNOSIS — D509 Iron deficiency anemia, unspecified: Secondary | ICD-10-CM

## 2023-04-22 NOTE — Progress Notes (Signed)
Complete physical exam  Patient: Erica Novak   DOB: [birth year]   21 y.o. Female  MRN: [id number]  Subjective:    Chief Complaint  Patient presents with   Annual Exam    Erica Novak is a 22 y.o. female who presents today for a complete physical exam. She reports consuming a general diet. The patient does not participate in regular exercise at present. She generally feels well. She reports sleeping well. She does not have additional problems to discuss today.   Currently lives with: grandparents and 53 month old baby Acute concerns or interim problems since last visit: no  Vision concerns: none Dental concerns: none STD concerns: none  Patient endorses ETOH use. - rare Patient endorses nicotine use. - daily vaping  Patient denies illegal substance use.     Females:  She is currently  sexually active  She denies  concerns today about STI Contraception choices are: condoms LMP: 04/01/23      Most recent fall risk assessment:    06/22/2022    1:13 PM  Fall Risk   Falls in the past year? 0  Number falls in past yr: 0  Injury with Fall? 0     Most recent depression screenings:    04/22/2023    1:35 PM 10/24/2022    2:09 PM  PHQ 2/9 Scores  PHQ - 2 Score 0 2  PHQ- 9 Score 7 4            Patient Care Team: Clayborne Dana, NP as PCP - General (Family Medicine)   Outpatient Medications Prior to Visit  Medication Sig   azelastine (ASTELIN) 0.1 % nasal spray Place 2 sprays into both nostrils 2 (two) times daily. Use in each nostril as directed   fluticasone (FLONASE) 50 MCG/ACT nasal spray Place 2 sprays into both nostrils daily.   levocetirizine (XYZAL) 5 MG tablet Take 1 tablet (5 mg total) by mouth every evening.   triamcinolone cream (KENALOG) 0.1 % Apply 1 application topically 2 (two) times daily.   No facility-administered medications prior to visit.    ROS All review of systems negative except what is listed in the HPI         Objective:     BP 124/85   Pulse 92   Ht 5\' 9"  (1.753 m)   Wt 154 lb (69.9 kg)   SpO2 99%   BMI 22.74 kg/m    Physical Exam Vitals reviewed.  Constitutional:      General: She is not in acute distress.    Appearance: Normal appearance. She is not ill-appearing.  HENT:     Head: Normocephalic and atraumatic.     Right Ear: Tympanic membrane normal.     Left Ear: Tympanic membrane normal.     Nose: Nose normal.     Mouth/Throat:     Mouth: Mucous membranes are moist.     Pharynx: Oropharynx is clear.  Eyes:     Extraocular Movements: Extraocular movements intact.     Conjunctiva/sclera: Conjunctivae normal.     Pupils: Pupils are equal, round, and reactive to light.  Neck:     Vascular: No carotid bruit.  Cardiovascular:     Rate and Rhythm: Normal rate and regular rhythm.     Pulses: Normal pulses.     Heart sounds: Normal heart sounds.  Pulmonary:     Effort: Pulmonary effort is normal.     Breath sounds: Normal breath sounds.  Abdominal:  General: Abdomen is flat. Bowel sounds are normal. There is no distension.     Palpations: Abdomen is soft. There is no mass.     Tenderness: There is no abdominal tenderness. There is no right CVA tenderness, left CVA tenderness, guarding or rebound.  Genitourinary:    Comments: Deferred exam Musculoskeletal:        General: Normal range of motion.     Cervical back: Normal range of motion and neck supple. No tenderness.     Right lower leg: No edema.     Left lower leg: No edema.  Lymphadenopathy:     Cervical: No cervical adenopathy.  Skin:    General: Skin is warm and dry.     Capillary Refill: Capillary refill takes less than 2 seconds.  Neurological:     General: No focal deficit present.     Mental Status: She is alert and oriented to person, place, and time. Mental status is at baseline.  Psychiatric:        Mood and Affect: Mood normal.        Behavior: Behavior normal.        Thought Content: Thought  content normal.        Judgment: Judgment normal.      No results found for any visits on 04/22/23.     Assessment & Plan:    Routine Health Maintenance and Physical Exam Discussed health promotion and safety including diet and exercise recommendations, dental health, and injury prevention. Tobacco cessation if applicable. Seat belts, sunscreen, smoke detectors, etc.    Immunization History  Administered Date(s) Administered   DTaP 08/25/2003, 10/25/2003, 12/31/2003, 12/28/2004, 05/06/2009   HIB (PRP-OMP) 08/25/2003, 11/04/2003, 12/31/2003, 12/28/2004   Hepatitis B 04-17-2003, 08/25/2003, 10/25/2003   IPV 08/25/2003, 10/25/2003, 12/31/2003, 05/06/2009   MMR 12/28/2004, 05/06/2009   Meningococcal Conjugate 07/26/2015   Pneumococcal Conjugate-13 08/25/2003, 10/25/2003, 12/31/2003, 07/06/2004   Tdap 04/13/2014, 11/30/2021   Varicella 07/06/2004, 05/06/2009    Health Maintenance  Topic Date Due   HPV VACCINES (1 - 2-dose series) Never done   CHLAMYDIA SCREENING  Never done   HIV Screening  Never done   Hepatitis C Screening  Never done   COVID-19 Vaccine (1) 05/08/2023 (Originally 12/05/2003)   INFLUENZA VACCINE  07/04/2023   DTaP/Tdap/Td (8 - Td or Tdap) 12/01/2031        Problem List Items Addressed This Visit     Iron deficiency anemia   Relevant Orders   IBC + Ferritin   CBC with Differential/Platelet   Other Visit Diagnoses     Annual physical exam    -  Primary   Relevant Orders   IBC + Ferritin   CBC with Differential/Platelet   Comprehensive metabolic panel      Return in about 1 year (around 04/21/2024) for physical.     Clayborne Dana, NP

## 2023-04-23 LAB — CBC WITH DIFFERENTIAL/PLATELET
Basophils Absolute: 0.1 10*3/uL (ref 0.0–0.1)
Basophils Relative: 1.1 % (ref 0.0–3.0)
Eosinophils Absolute: 0.1 10*3/uL (ref 0.0–0.7)
Eosinophils Relative: 1.5 % (ref 0.0–5.0)
HCT: 40.9 % (ref 36.0–49.0)
Hemoglobin: 13.4 g/dL (ref 12.0–16.0)
Lymphocytes Relative: 35 % (ref 24.0–48.0)
Lymphs Abs: 2.1 10*3/uL (ref 0.7–4.0)
MCHC: 32.9 g/dL (ref 31.0–37.0)
MCV: 91.6 fl (ref 78.0–98.0)
Monocytes Absolute: 0.6 10*3/uL (ref 0.1–1.0)
Monocytes Relative: 9.8 % (ref 3.0–12.0)
Neutro Abs: 3.1 10*3/uL (ref 1.4–7.7)
Neutrophils Relative %: 52.6 % (ref 43.0–71.0)
Platelets: 245 10*3/uL (ref 150.0–575.0)
RBC: 4.46 Mil/uL (ref 3.80–5.70)
RDW: 13.1 % (ref 11.4–15.5)
WBC: 5.9 10*3/uL (ref 4.5–13.5)

## 2023-04-23 LAB — COMPREHENSIVE METABOLIC PANEL
ALT: 13 U/L (ref 0–35)
AST: 21 U/L (ref 0–37)
Albumin: 4.8 g/dL (ref 3.5–5.2)
Alkaline Phosphatase: 49 U/L (ref 47–119)
BUN: 12 mg/dL (ref 6–23)
CO2: 26 mEq/L (ref 19–32)
Calcium: 10 mg/dL (ref 8.4–10.5)
Chloride: 102 mEq/L (ref 96–112)
Creatinine, Ser: 0.99 mg/dL (ref 0.40–1.20)
GFR: 82.44 mL/min (ref >60.00)
Glucose, Bld: 77 mg/dL (ref 70–99)
Potassium: 4.4 mEq/L (ref 3.5–5.1)
Sodium: 139 mEq/L (ref 135–145)
Total Bilirubin: 0.9 mg/dL (ref 0.2–1.2)
Total Protein: 7.3 g/dL (ref 6.0–8.3)

## 2023-04-23 LAB — IBC + FERRITIN
Ferritin: 38.8 ng/mL (ref 10.0–291.0)
Iron: 175 ug/dL — ABNORMAL HIGH (ref 42–145)
Saturation Ratios: 53.9 % — ABNORMAL HIGH (ref 20.0–50.0)
TIBC: 324.8 ug/dL (ref 250.0–450.0)
Transferrin: 232 mg/dL (ref 212.0–360.0)

## 2023-07-18 DIAGNOSIS — G44309 Post-traumatic headache, unspecified, not intractable: Secondary | ICD-10-CM | POA: Diagnosis not present

## 2023-07-18 DIAGNOSIS — S0990XA Unspecified injury of head, initial encounter: Secondary | ICD-10-CM | POA: Diagnosis not present

## 2023-09-17 ENCOUNTER — Other Ambulatory Visit: Payer: Self-pay

## 2023-09-20 ENCOUNTER — Ambulatory Visit: Payer: No Typology Code available for payment source | Admitting: Family Medicine

## 2023-10-01 ENCOUNTER — Telehealth: Payer: Self-pay
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
## 2023-10-01 NOTE — Telephone Encounter (Signed)
Pt going to [facility]

## 2023-10-21 DIAGNOSIS — N643 Galactorrhea not associated with childbirth: Secondary | ICD-10-CM | POA: Diagnosis not present

## 2023-12-09 ENCOUNTER — Ambulatory Visit: Payer: No Typology Code available for payment source | Admitting: Family Medicine

## 2023-12-16 ENCOUNTER — Ambulatory Visit: Payer: Medicaid Other | Admitting: Family Medicine

## 2024-02-26 DIAGNOSIS — N926 Irregular menstruation, unspecified: Secondary | ICD-10-CM | POA: Diagnosis not present

## 2024-02-26 DIAGNOSIS — M545 Low back pain, unspecified: Secondary | ICD-10-CM | POA: Diagnosis not present

## 2024-02-26 DIAGNOSIS — N9089 Other specified noninflammatory disorders of vulva and perineum: Secondary | ICD-10-CM | POA: Diagnosis not present

## 2024-02-26 DIAGNOSIS — R1032 Left lower quadrant pain: Secondary | ICD-10-CM | POA: Diagnosis not present

## 2024-02-26 DIAGNOSIS — R1031 Right lower quadrant pain: Secondary | ICD-10-CM | POA: Diagnosis not present

## 2024-04-22 ENCOUNTER — Encounter: Payer: No Typology Code available for payment source | Admitting: Family Medicine

## 2024-05-04 ENCOUNTER — Encounter: Payer: No Typology Code available for payment source | Admitting: Family Medicine

## 2024-05-04 DIAGNOSIS — Z0000 Encounter for general adult medical examination without abnormal findings: Secondary | ICD-10-CM

## 2024-05-11 ENCOUNTER — Ambulatory Visit: Payer: Self-pay | Admitting: Family Medicine

## 2024-05-18 ENCOUNTER — Encounter: Payer: Self-pay | Admitting: Family Medicine

## 2024-05-18 ENCOUNTER — Other Ambulatory Visit (HOSPITAL_BASED_OUTPATIENT_CLINIC_OR_DEPARTMENT_OTHER): Payer: Self-pay

## 2024-05-18 ENCOUNTER — Ambulatory Visit (INDEPENDENT_AMBULATORY_CARE_PROVIDER_SITE_OTHER): Payer: Self-pay | Admitting: Family Medicine

## 2024-05-18 VITALS — BP 135/84 | HR 73 | Ht 69.0 in | Wt 153.0 lb

## 2024-05-18 DIAGNOSIS — Z0000 Encounter for general adult medical examination without abnormal findings: Secondary | ICD-10-CM | POA: Diagnosis not present

## 2024-05-18 DIAGNOSIS — Z1159 Encounter for screening for other viral diseases: Secondary | ICD-10-CM | POA: Diagnosis not present

## 2024-05-18 DIAGNOSIS — B353 Tinea pedis: Secondary | ICD-10-CM

## 2024-05-18 DIAGNOSIS — Z114 Encounter for screening for human immunodeficiency virus [HIV]: Secondary | ICD-10-CM

## 2024-05-18 DIAGNOSIS — Z77120 Contact with and (suspected) exposure to mold (toxic): Secondary | ICD-10-CM

## 2024-05-18 MED ORDER — KETOCONAZOLE 2 % EX CREA
1.0000 | TOPICAL_CREAM | Freq: Every day | CUTANEOUS | 0 refills | Status: DC
  Filled 2024-05-18: qty 15, 15d supply, fill #0

## 2024-05-18 NOTE — Patient Instructions (Signed)
 Routine physical labs today No way for me to check you for mold exposure - a company will need to come test the house/building for potential mold.  Referral to allergist to see if you have a mold allergy. They will call you to schedule.

## 2024-05-18 NOTE — Progress Notes (Signed)
 Complete physical exam  Patient: Erica Novak   DOB: 2001-11-12   22 y.o. Female  MRN: [id number]  Subjective:    Chief Complaint  Patient presents with   Annual Exam    Erica Novak is a 23 y.o. female who presents today for a complete physical exam. She reports consuming a general diet. The patient does not participate in regular exercise at present. She generally feels fairly well. She reports sleeping fairly well. She does have additional problems to discuss today.   Discussed the use of AI scribe software for clinical note transcription with the patient, who gave verbal consent to proceed.  History of Present Illness Erica Novak is a 23 year old female who presents with respiratory symptoms and fatigue potentially related to mold exposure at work.  Symptoms began around June 3rd after she was moved to work in a building she believes is full of mold. Initially, she experienced a high fever up to 105F, followed by upper respiratory symptoms including persistent coughing, sneezing, and nasal congestion, particularly when at work. These symptoms improve when she is away from the work environment, such as on days off.  She feels constantly tired since the onset of her illness and has experienced episodes of near-syncope at work, where her vision goes black. These episodes primarily occur at work and not at home. A coworker also experiences similar symptoms when entering the same building.  She has been out of work multiple days since June 3rd due to her illness and her son's illness, impacting her work hours significantly, leading to discussions about applying for short-term disability.  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 She uses athlete's foot powder with limited relief and describes the condition as itchy, flaky, and dry, particularly in her work shoes.  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 She vapes daily and rarely consumes alcohol. She is not sexually active and does not use recreational drugs.     Currently lives with: son (70 years old) Acute concerns or interim problems since last visit: see narrative above  Vision concerns: no Dental concerns: no STD concerns: no  ETOH use: rare Nicotine use: vaping daily Recreational drugs/illegal substances: no   Females:  She is not currently  sexually active  Contraception choices are: none LMP: 05/18/24     Most recent fall risk assessment:    06/22/2022    1:13 PM  Fall Risk   Falls in the past year? 0  Number falls in past yr: 0  Injury with Fall? 0     Most recent depression screenings:    04/22/2023    1:35 PM 10/24/2022    2:09 PM  PHQ 2/9 Scores  PHQ - 2 Score 0 2  PHQ- 9 Score 7 4            Patient Care Team: Everlina Hock, NP as PCP - General (Family Medicine)   Outpatient Medications Prior to Visit  Medication Sig   azelastine  (ASTELIN ) 0.1 % nasal spray Place 2 sprays into both nostrils 2 (two) times daily. Use in each nostril as directed   fluticasone  (FLONASE ) 50 MCG/ACT nasal spray Place 2 sprays into both nostrils daily.   levocetirizine (XYZAL ) 5 MG tablet Take 1 tablet (5 mg total) by mouth every evening.   triamcinolone  cream (KENALOG ) 0.1 %  Apply 1 application topically 2 (two) times daily.   No facility-administered medications prior to visit.    ROS All review of systems negative except what is listed in the HPI        Objective:     BP 135/84   Pulse 73   Ht 5' 9 (1.753 m)   Wt 153 lb (69.4 kg)   SpO2 100%   BMI 22.59 kg/m    Physical Exam Vitals reviewed.  Constitutional:      General: She is not in acute distress.    Appearance: Normal appearance. She is not ill-appearing.  HENT:     Head: Normocephalic and atraumatic.     Right Ear: Tympanic membrane normal.     Left  Ear: Tympanic membrane normal.     Nose: Nose normal.     Mouth/Throat:     Mouth: Mucous membranes are moist.     Pharynx: Oropharynx is clear.   Eyes:     Extraocular Movements: Extraocular movements intact.     Conjunctiva/sclera: Conjunctivae normal.     Pupils: Pupils are equal, round, and reactive to light.    Cardiovascular:     Rate and Rhythm: Normal rate and regular rhythm.     Pulses: Normal pulses.     Heart sounds: Normal heart sounds.  Pulmonary:     Effort: Pulmonary effort is normal.     Breath sounds: Normal breath sounds.  Abdominal:     General: Abdomen is flat. Bowel sounds are normal. There is no distension.     Palpations: Abdomen is soft. There is no mass.     Tenderness: There is no abdominal tenderness. There is no right CVA tenderness, left CVA tenderness, guarding or rebound.  Genitourinary:    Comments: Deferred exam  Musculoskeletal:        General: Normal range of motion.     Cervical back: Normal range of motion and neck supple. No tenderness.     Right lower leg: No edema.     Left lower leg: No edema.  Lymphadenopathy:     Cervical: No cervical adenopathy.   Skin:    General: Skin is warm and dry.     Capillary Refill: Capillary refill takes less than 2 seconds.     Comments: Left foot with red, dry, flaky rash   Neurological:     General: No focal deficit present.     Mental Status: She is alert and oriented to person, place, and time. Mental status is at baseline.   Psychiatric:        Mood and Affect: Mood normal.        Behavior: Behavior normal.        Thought Content: Thought content normal.        Judgment: Judgment normal.         No results found for any visits on 05/18/24.     Assessment & Plan:    Routine Health Maintenance and Physical Exam Discussed health promotion and safety including diet and exercise recommendations, dental health, and injury prevention. Tobacco cessation if applicable. Seat belts,  sunscreen, smoke detectors, etc.    Immunization History  Administered Date(s) Administered   DTaP 08/25/2003, 10/25/2003, 12/31/2003, 12/28/2004, 05/06/2009   Dtap, Unspecified 08/25/2003, 10/25/2003, 12/31/2003, 12/28/2004, 05/06/2009   HIB (PRP-OMP) 08/25/2003, 11/04/2003, 12/31/2003, 12/28/2004   HIB, Unspecified 08/25/2003, 11/04/2003, 12/31/2003, 12/28/2004   Hep B, Unspecified 10/27/2003, 08/25/2003, 10/25/2003   Hepatitis B 2003[birth year]05[birth year], 08/25/2003, 10/25/2003   IPV 08/25/2003, 10/25/2003,  12/31/2003, 05/06/2009   MMR 12/28/2004, 05/06/2009   Meningococcal Conjugate 07/26/2015   Pneumococcal Conjugate-13 08/25/2003, 10/25/2003, 12/31/2003, 07/06/2004   Pneumococcal-Unspecified 08/25/2003, 10/25/2003, 12/31/2003, 07/06/2004   Polio, Unspecified 08/25/2003, 10/25/2003, 12/31/2003, 05/06/2009   Tdap 04/13/2014, 11/30/2021   Varicella 07/06/2004, 05/06/2009    Health Maintenance  Topic Date Due   CHLAMYDIA SCREENING  Never done   HPV VACCINES (1 - 3-dose series) Never done   HIV Screening  Never done   Meningococcal B Vaccine (1 of 2 - Standard) Never done   Hepatitis C Screening  Never done   Pneumococcal Vaccine 91-47 Years old (1 of 1 - PPSV23) 05/01/2025 (Originally 06/03/2009)   COVID-19 Vaccine (1 - 2024-25 season) 05/01/2025 (Originally 08/04/2023)   INFLUENZA VACCINE  07/03/2024   DTaP/Tdap/Td (8 - Td or Tdap) 12/01/2031        Problem List Items Addressed This Visit   None Visit Diagnoses       Annual physical exam    -  Primary   Relevant Orders   CBC with Differential/Platelet   Comprehensive metabolic panel with GFR   Lipid panel   TSH     Tinea pedis of left foot       Relevant Medications   ketoconazole (NIZORAL) 2 % cream     Mold suspected exposure       Relevant Orders   Ambulatory referral to Allergy     Encounter for hepatitis C screening test for low risk patient       Relevant Orders   Hepatitis C antibody     Encounter for screening for HIV        Relevant Orders   HIV Antibody (routine testing w rflx)      Routine physical labs today No way for me to check you for mold exposure - a company will need to come test the house/building for potential mold.  Referral to allergist to see if you have a mold allergy. They will call you to schedule.     PATIENT COUNSELING:   Advised to take 1 mg of folate supplement per day if capable of pregnancy.   Encouraged smoking cessation.   Recommend that most people either abstain from alcohol or drink within safe limits (<=14/week and <=4 drinks/occasion for males, <=7/weeks and <= 3 drinks/occasion for females) and that the risk for alcohol disorders and other health effects rises proportionally with the number of drinks per week and how often a drinker exceeds daily limits.  Diet: Recommend to adjust caloric intake to maintain or achieve ideal body weight, to reduce intake of dietary saturated fat and total fat, to limit sodium intake by avoiding high sodium foods and not adding table salt, and to maintain adequate dietary potassium and calcium preferably from fresh fruits, vegetables, and low-fat dairy products.   Emphasized the importance of regular exercise.  Injury prevention: Recommend seatbelts, safety helmets, smoke detector, etc..   Dental health: Recommend regular tooth brushing, flossing, and dental visits.       Return in about 1 year (around 05/18/2025) for physical.     Everlina Hock, NP

## 2024-05-19 ENCOUNTER — Encounter: Payer: Self-pay | Admitting: Family Medicine

## 2024-05-19 LAB — CBC WITH DIFFERENTIAL/PLATELET
Absolute Lymphocytes: 1643 {cells}/uL (ref 850–3900)
Absolute Monocytes: 569 {cells}/uL (ref 200–950)
Basophils Absolute: 37 {cells}/uL (ref 0–200)
Basophils Relative: 0.5 %
Eosinophils Absolute: 117 {cells}/uL (ref 15–500)
Eosinophils Relative: 1.6 %
HCT: 41.4 % (ref 35.0–45.0)
Hemoglobin: 13.4 g/dL (ref 11.7–15.5)
MCH: 30.1 pg (ref 27.0–33.0)
MCHC: 32.4 g/dL (ref 32.0–36.0)
MCV: 93 fL (ref 80.0–100.0)
MPV: 10.8 fL (ref 7.5–12.5)
Monocytes Relative: 7.8 %
Neutro Abs: 4935 {cells}/uL (ref 1500–7800)
Neutrophils Relative %: 67.6 %
Platelets: 239 10*3/uL (ref 140–400)
RBC: 4.45 10*6/uL (ref 3.80–5.10)
RDW: 12.3 % (ref 11.0–15.0)
Total Lymphocyte: 22.5 %
WBC: 7.3 10*3/uL (ref 3.8–10.8)

## 2024-05-19 LAB — COMPREHENSIVE METABOLIC PANEL WITH GFR
AG Ratio: 2.3 (calc) (ref 1.0–2.5)
ALT: 12 U/L (ref 6–29)
AST: 16 U/L (ref 10–30)
Albumin: 5 g/dL (ref 3.6–5.1)
Alkaline phosphatase (APISO): 54 U/L (ref 31–125)
BUN: 11 mg/dL (ref 7–25)
CO2: 21 mmol/L (ref 20–32)
Calcium: 9.8 mg/dL (ref 8.6–10.2)
Chloride: 105 mmol/L (ref 98–110)
Creat: 0.9 mg/dL (ref 0.50–0.96)
Globulin: 2.2 g/dL (ref 1.9–3.7)
Glucose, Bld: 93 mg/dL (ref 65–99)
Potassium: 4.4 mmol/L (ref 3.5–5.3)
Sodium: 141 mmol/L (ref 135–146)
Total Bilirubin: 0.7 mg/dL (ref 0.2–1.2)
Total Protein: 7.2 g/dL (ref 6.1–8.1)
eGFR: 94 mL/min/{1.73_m2} (ref >=60)

## 2024-05-19 LAB — LIPID PANEL
Cholesterol: 156 mg/dL (ref <200)
HDL: 96 mg/dL (ref >=50)
LDL Cholesterol (Calc): 46 mg/dL
Non-HDL Cholesterol (Calc): 60 mg/dL (ref <130)
Total CHOL/HDL Ratio: 1.6 (calc) (ref <5.0)
Triglycerides: 56 mg/dL (ref <150)

## 2024-05-19 LAB — TSH: TSH: 0.68 m[IU]/L

## 2024-05-19 LAB — HEPATITIS C ANTIBODY: Hepatitis C Ab: NONREACTIVE

## 2024-05-19 LAB — HIV ANTIBODY (ROUTINE TESTING W REFLEX): HIV 1&2 Ab, 4th Generation: NONREACTIVE

## 2024-05-20 ENCOUNTER — Ambulatory Visit: Payer: Self-pay | Admitting: Family Medicine

## 2024-06-02 ENCOUNTER — Encounter: Payer: Self-pay | Admitting: Family Medicine

## 2024-06-02 DIAGNOSIS — L309 Dermatitis, unspecified: Secondary | ICD-10-CM

## 2024-06-03 ENCOUNTER — Other Ambulatory Visit (HOSPITAL_BASED_OUTPATIENT_CLINIC_OR_DEPARTMENT_OTHER): Payer: Self-pay

## 2024-06-03 MED ORDER — TRIAMCINOLONE ACETONIDE 0.1 % EX CREA
1.0000 | TOPICAL_CREAM | Freq: Two times a day (BID) | CUTANEOUS | 3 refills | Status: DC
  Filled 2024-06-03: qty 30, 15d supply, fill #0

## 2024-06-15 ENCOUNTER — Other Ambulatory Visit (HOSPITAL_BASED_OUTPATIENT_CLINIC_OR_DEPARTMENT_OTHER): Payer: Self-pay

## 2024-06-16 ENCOUNTER — Ambulatory Visit: Admitting: Internal Medicine

## 2024-06-22 ENCOUNTER — Other Ambulatory Visit (HOSPITAL_BASED_OUTPATIENT_CLINIC_OR_DEPARTMENT_OTHER): Payer: Self-pay

## 2024-08-06 ENCOUNTER — Encounter: Payer: Self-pay | Admitting: Family Medicine

## 2024-08-06 ENCOUNTER — Other Ambulatory Visit (HOSPITAL_BASED_OUTPATIENT_CLINIC_OR_DEPARTMENT_OTHER): Payer: Self-pay

## 2024-08-06 ENCOUNTER — Ambulatory Visit (INDEPENDENT_AMBULATORY_CARE_PROVIDER_SITE_OTHER): Payer: Self-pay | Admitting: Family Medicine

## 2024-08-06 VITALS — BP 139/93 | HR 78 | Ht 69.0 in | Wt 147.0 lb

## 2024-08-06 DIAGNOSIS — R21 Rash and other nonspecific skin eruption: Secondary | ICD-10-CM | POA: Diagnosis not present

## 2024-08-06 DIAGNOSIS — Z720 Tobacco use: Secondary | ICD-10-CM | POA: Diagnosis not present

## 2024-08-06 DIAGNOSIS — F419 Anxiety disorder, unspecified: Secondary | ICD-10-CM | POA: Diagnosis not present

## 2024-08-06 MED ORDER — BUSPIRONE HCL 5 MG PO TABS
5.0000 mg | ORAL_TABLET | Freq: Two times a day (BID) | ORAL | 1 refills | Status: DC
  Filled 2024-08-06: qty 60, 30d supply, fill #0

## 2024-08-06 NOTE — Progress Notes (Signed)
 Acute Office Visit  Subjective:     Patient ID: Erica Novak, female    DOB: 06-18-2001, 23 y.o.   MRN: [id number]  Chief Complaint  Patient presents with   Anxiety     Patient is in today for anxiety.   Discussed the use of AI scribe software for clinical note transcription with the patient, who gave verbal consent to proceed.  History of Present Illness Erica Novak is a 23 year old female who presents with anxiety and recurrent panic attacks.  She has been experiencing increased anxiety recently, attributing it to personal stressors. Panic attacks have become more frequent over the past few months, though not as severe as a previous episode that required hospitalization at age 25 or 54. At that time, she was prescribed hydroxyzine  but only took it once due to its sedative effects, which she cannot afford now due to her responsibilities, including caring for her two-year-old and working full-time.  She denies depression but feels 'very tired all the time' and 'always overthinking things.' Her anxiety is characterized by a racing mind and easy agitation. She is open to trying medication for her anxiety but is concerned about potential side effects, such as feeling like a 'zombie,' which she experienced with hydroxyzine .  She has recently stopped vaping and switched to using nicotine pouches, consuming about two 3 mg pouches per day. She acknowledges that nicotine is a stimulant and may contribute to her anxiety. She works in a [employer], [employer] and works long shifts without breaks.  She reports a persistent skin issue on her foot, described as eczema-like. She has been using triamcinolone  cream, which helps temporarily, but the condition recurs, especially after working long shifts where her feet are enclosed in non-breathable shoes. She has tried ketoconazole  without success. The condition improves when she is off work and can leave her feet uncovered and apply triamcinolone , but  recurs after a day or two of working.           08/06/2024    2:20 PM 04/22/2023    1:35 PM 10/24/2022    2:09 PM  PHQ9 SCORE ONLY  PHQ-9 Total Score 16 7  4       Data saved with a previous flowsheet row definition      08/06/2024    2:20 PM 04/22/2023    1:35 PM 10/24/2022    2:10 PM  GAD 7 : Generalized Anxiety Score  Nervous, Anxious, on Edge 3 1 1   Control/stop worrying 3 1 1   Worry too much - different things 2 1 0  Trouble relaxing 2 1 1   Restless 2 0 0  Easily annoyed or irritable 1 2 2   Afraid - awful might happen 2 0 0  Total GAD 7 Score 15 6 5   Anxiety Difficulty Very difficult Not difficult at all Not difficult at all       ROS All review of systems negative except what is listed in the HPI      Objective:    BP (!) 139/93   Pulse 78   Ht 5' 9 (1.753 m)   Wt 147 lb (66.7 kg)   SpO2 100%   BMI 21.71 kg/m    Physical Exam Vitals reviewed.  Constitutional:      Appearance: Normal appearance.  Cardiovascular:     Rate and Rhythm: Normal rate and regular rhythm.     Heart sounds: Normal heart sounds.  Pulmonary:     Effort: Pulmonary effort is normal.  Breath sounds: Normal breath sounds.  Skin:    General: Skin is warm and dry.     Comments: Left great toe/foot with eczema  Neurological:     Mental Status: She is alert and oriented to person, place, and time.  Psychiatric:        Mood and Affect: Mood normal.        Behavior: Behavior normal.        Thought Content: Thought content normal.        Judgment: Judgment normal.       No results found for any visits on 08/06/24.      Assessment & Plan:   Problem List Items Addressed This Visit   None Visit Diagnoses       Anxiety    -  Primary   Relevant Medications   busPIRone  (BUSPAR ) 5 MG tablet     Rash         Nicotine use           Assessment & Plan Generalized anxiety disorder Increased anxiety and panic attacks due to stressors. Hydroxyzine  caused drowsiness.  Buspirone  considered for its efficacy and non-sedative effects. She expressed concern about sedation. - Prescribe Buspirone  5 mg twice daily. - Advise avoiding alcohol with anxiety medications. - Schedule follow-up in 1-2 months to assess medication efficacy and adjust dosage if necessary.  Eczema of left foot Triamcinolone  provides temporary relief. Condition improves with air exposure, likely worsened by moisture and heat. - Continue triamcinolone  cream 0.5% twice daily as needed. - Advise using eczema-friendly lotion such as Eucerin, CeraVe, or Aveeno after steroid cream. - Recommend moisture-wicking socks and shoes with better airflow. - Consider dermatology referral if no improvement.  Nicotine dependence, current use of nicotine pouches Switched from vaping to nicotine pouches, using twice daily. Nicotine intake reduced but not ready to quit due to work environment. Acknowledges nicotine as a stimulant affecting anxiety. - Encourage continued reduction of nicotine use. - Advise on potential impact of nicotine on anxiety levels.  Elevated blood pressure, unspecified Blood pressure elevated during visit. Recent switch from vaping to nicotine pouches may contribute. - Recheck blood pressure at follow-up appointment in one month.       Meds ordered this encounter  Medications   busPIRone  (BUSPAR ) 5 MG tablet    Sig: Take 1 tablet (5 mg total) by mouth 2 (two) times daily.    Dispense:  60 tablet    Refill:  1    Supervising Provider:   DOMENICA BLACKBIRD A [[id number]]    Return in about 4 weeks (around 09/03/2024) for mood follow-up.  Waddell KATHEE Mon, NP

## 2024-08-10 ENCOUNTER — Encounter: Payer: Self-pay | Admitting: Family Medicine

## 2024-08-10 ENCOUNTER — Other Ambulatory Visit (HOSPITAL_BASED_OUTPATIENT_CLINIC_OR_DEPARTMENT_OTHER): Payer: Self-pay

## 2024-08-10 DIAGNOSIS — G4700 Insomnia, unspecified: Secondary | ICD-10-CM

## 2024-08-10 DIAGNOSIS — F419 Anxiety disorder, unspecified: Secondary | ICD-10-CM

## 2024-08-10 MED ORDER — HYDROXYZINE HCL 10 MG PO TABS
10.0000 mg | ORAL_TABLET | Freq: Every evening | ORAL | 0 refills | Status: DC | PRN
  Filled 2024-08-10: qty 30, 30d supply, fill #0

## 2024-08-24 ENCOUNTER — Ambulatory Visit: Admitting: Family Medicine

## 2024-08-24 ENCOUNTER — Other Ambulatory Visit (HOSPITAL_BASED_OUTPATIENT_CLINIC_OR_DEPARTMENT_OTHER): Payer: Self-pay

## 2024-08-24 MED ORDER — TRAZODONE HCL 50 MG PO TABS: 25.0000 mg | ORAL_TABLET | Freq: Every evening | ORAL | 1 refills | Status: DC | PRN

## 2024-08-24 MED ORDER — TRAZODONE HCL 50 MG PO TABS
25.0000 mg | ORAL_TABLET | Freq: Every evening | ORAL | 1 refills | Status: DC | PRN
  Filled 2024-08-24: qty 30, 30d supply, fill #0

## 2024-08-24 MED ORDER — BUSPIRONE HCL 5 MG PO TABS
7.5000 mg | ORAL_TABLET | Freq: Two times a day (BID) | ORAL | 1 refills | Status: DC
  Filled 2024-08-24: qty 90, 30d supply, fill #0

## 2024-08-24 NOTE — Addendum Note (Signed)
 Addended by: Kilah Drahos L on: 08/24/2024 04:35 PM   Modules accepted: Orders

## 2024-08-24 NOTE — Addendum Note (Signed)
 Addended by: ALMARIE BIRMINGHAM B on: 08/24/2024 04:16 PM   Modules accepted: Orders

## 2024-09-08 ENCOUNTER — Ambulatory Visit: Admitting: Family Medicine

## 2024-09-08 ENCOUNTER — Other Ambulatory Visit (HOSPITAL_BASED_OUTPATIENT_CLINIC_OR_DEPARTMENT_OTHER): Payer: Self-pay

## 2024-09-08 VITALS — BP 122/71 | HR 80 | Ht 69.0 in | Wt 148.0 lb

## 2024-09-08 DIAGNOSIS — G4700 Insomnia, unspecified: Secondary | ICD-10-CM

## 2024-09-08 DIAGNOSIS — W57XXXA Bitten or stung by nonvenomous insect and other nonvenomous arthropods, initial encounter: Secondary | ICD-10-CM | POA: Diagnosis not present

## 2024-09-08 DIAGNOSIS — R509 Fever, unspecified: Secondary | ICD-10-CM

## 2024-09-08 DIAGNOSIS — S80861A Insect bite (nonvenomous), right lower leg, initial encounter: Secondary | ICD-10-CM

## 2024-09-08 DIAGNOSIS — F419 Anxiety disorder, unspecified: Secondary | ICD-10-CM

## 2024-09-08 LAB — POC COVID19 BINAXNOW: SARS Coronavirus 2 Ag: NEGATIVE

## 2024-09-08 LAB — POCT INFLUENZA A/B
Influenza A, POC: NEGATIVE
Influenza B, POC: NEGATIVE

## 2024-09-08 MED ORDER — DOXYCYCLINE HYCLATE 100 MG PO TABS
100.0000 mg | ORAL_TABLET | Freq: Two times a day (BID) | ORAL | 0 refills | Status: AC
  Filled 2024-09-08: qty 10, 5d supply, fill #0

## 2024-09-08 MED ORDER — BUSPIRONE HCL 10 MG PO TABS
10.0000 mg | ORAL_TABLET | Freq: Two times a day (BID) | ORAL | 1 refills | Status: DC
  Filled 2024-09-08: qty 60, 30d supply, fill #0
  Filled 2024-10-03: qty 60, 30d supply, fill #1

## 2024-09-08 MED ORDER — TRAZODONE HCL 50 MG PO TABS
50.0000 mg | ORAL_TABLET | Freq: Every evening | ORAL | 1 refills | Status: DC | PRN
  Filled 2024-09-08: qty 30, 15d supply, fill #0
  Filled 2024-10-03: qty 30, 15d supply, fill #1

## 2024-09-08 NOTE — Progress Notes (Signed)
 Acute Office Visit  Subjective:     Patient ID: Erica Novak, female    DOB: 2001/11/29, 23 y.o.   MRN: [id number]  Chief Complaint  Patient presents with   Medical Management of Chronic Issues    HPI Patient is in today for mood follow-up and fever.   Discussed the use of AI scribe software for clinical note transcription with the patient, who gave verbal consent to proceed.  History of Present Illness Erica Novak is a 23 year old female who presents with worsening mood symptoms and recent illness.  Mood symptoms have not improved significantly with current medication regimen. She is taking Buspirone  7.5 mg twice a day, which initially provided relief within 30 minutes to an hour, but she no longer feels the same effect. She has a limited supply of the medication remaining.  Experiencing significant sleep disturbances, unable to fall asleep until around 3 AM. No anxiety as a cause, describing her mind as 'just thinking about things.' Tried trazodone , starting with half a tablet for three days and then a full tablet, without improvement. Hydroxyzine  did not induce drowsiness, and she remains awake until 3 AM despite taking it at 7:30 PM. Once asleep, she stays asleep. Practices good sleep hygiene and does not nap during the day.  Recent illness with symptoms starting yesterday, including a throbbing headache, fever, and severe fatigue. Took Robitussin before bed and has not taken any other medications since. Today, feels slightly better but still under the weather, with body aches and a desire to go home. Reports a cough yesterday and sneezing today, with a sensation of swelling where her nose and throat connect. This morning, expelled a large, white, bloody glob from her nose.  Mentions a bug bite on her right leg, acquired in [place]  the weekend of September 27th. It became painful and larger yesterday, with redness and itchiness. It has not drained. Her left ear also  hurts slightly.     Mood follow-up: - Diagnosis: Anxiety - Treatment: Buspar  7.5 mg BID - Medication side effects: Headaches. - SI/HI: None.        ROS All review of systems negative except what is listed in the HPI      Objective:    BP 122/71   Pulse 80   Ht 5' 9 (1.753 m)   Wt 148 lb (67.1 kg)   SpO2 100%   BMI 21.86 kg/m    Physical Exam Vitals reviewed.  Constitutional:      Appearance: Normal appearance.  HENT:     Head: Normocephalic and atraumatic.     Right Ear: Tympanic membrane normal.     Left Ear: Tympanic membrane normal.     Nose: Congestion present.  Cardiovascular:     Rate and Rhythm: Normal rate and regular rhythm.     Heart sounds: Normal heart sounds.  Pulmonary:     Effort: Pulmonary effort is normal.     Breath sounds: Normal breath sounds.  Skin:    General: Skin is warm and dry.     Comments: Right leg with raised insect bite with surrounding erythema and induration, no fluctuance or drainage  Neurological:     Mental Status: She is alert and oriented to person, place, and time.  Psychiatric:        Mood and Affect: Mood normal.        Behavior: Behavior normal.        Thought Content: Thought content normal.  Judgment: Judgment normal.         Results for orders placed or performed in visit on 09/08/24  POCT Influenza A/B  Result Value Ref Range   Influenza A, POC Negative Negative   Influenza B, POC Negative Negative  POC COVID-19 BinaxNow  Result Value Ref Range   SARS Coronavirus 2 Ag Negative Negative        Assessment & Plan:   Problem List Items Addressed This Visit   None Visit Diagnoses       Insect bite of right lower leg, initial encounter    -  Primary   Relevant Medications   doxycycline  (VIBRA -TABS) 100 MG tablet     Anxiety       Relevant Medications   busPIRone  (BUSPAR ) 10 MG tablet   traZODone  (DESYREL ) 50 MG tablet     Insomnia, unspecified type       Relevant Medications    traZODone  (DESYREL ) 50 MG tablet     Fever, unspecified fever cause       Relevant Orders   POCT Influenza A/B (Completed)   POC COVID-19 BinaxNow (Completed)      Assessment and Plan Assessment & Plan Generalized anxiety disorder and insomnia Current Buspirone  and Trazodone  doses are ineffective for anxiety and sleep onset. Hydroxyzine  does not induce drowsiness.  - Increase Buspirone  to 10 mg twice daily. Instruct her to take two 5 mg tablets until depleted. - Increase Trazodone  to 100 mg at bedtime. - Reinforce sleep hygiene: avoid screens, caffeine, meals before bed; use relaxation techniques.  Acute upper respiratory infection Symptoms include headache, fatigue, nasal congestion. Fever resolved. Negative COVID-19 and influenza tests today in office. - Continue supportive measures including rest, hydration, humidifier use, steam showers, warm compresses to sinuses, warm liquids with lemon and honey, and over-the-counter cough, cold, and analgesics as needed.    Infected bug bite, right leg (cellulitis) Bug bite on right leg infected with spreading redness and tenderness, no drainage. Allergic to penicillin and sulfa drugs. Doxycycline  chosen for efficacy in skin infections. - Prescribe doxycycline . - Apply hot compresses multiple times daily.       Meds ordered this encounter  Medications   busPIRone  (BUSPAR ) 10 MG tablet    Sig: Take 1 tablet (10 mg total) by mouth 2 (two) times daily.    Dispense:  60 tablet    Refill:  1    Supervising Provider:   DOMENICA BLACKBIRD A [[id number]]   traZODone  (DESYREL ) 50 MG tablet    Sig: Take 1-2 tablets (50-100 mg total) by mouth at bedtime as needed for sleep.    Dispense:  30 tablet    Refill:  1    Supervising Provider:   DOMENICA BLACKBIRD A [[id number]]   doxycycline  (VIBRA -TABS) 100 MG tablet    Sig: Take 1 tablet (100 mg total) by mouth 2 (two) times daily for 5 days.    Dispense:  10 tablet    Refill:  0    Supervising Provider:   DOMENICA BLACKBIRD A [[id number]]    Return in about 6 weeks (around 10/20/2024) for mood follow-up.  Erica KATHEE Mon, NP

## 2024-09-10 ENCOUNTER — Ambulatory Visit: Admitting: Family Medicine

## 2024-10-05 ENCOUNTER — Ambulatory Visit: Admitting: Family Medicine

## 2024-10-05 NOTE — Progress Notes (Incomplete)
 Acute Office Visit  Subjective:  Patient ID: Erica Novak, female    DOB: 2001/03/23  Age: 23 y.o. MRN: [id number]  CC: No chief complaint on file.     HPI Verbena A Novak is here for Worsening Rash.        Rash, Left Foot: - Seen 05/18/2024 and 08/06/2024 for similar symptoms of itchy, flaky, dry rash on left foot that worsens when wearing enclosed shoes for long periods of time, such as when she is at work.  - Has previously been prescribed Triamcinolone  cream, which she has used with temporary relief before recurrence after working long shifts. Also tried Ketoconazole  without relief.    Past Medical History:  Diagnosis Date   Allergy    Anemia    Anxiety    Asthma, chronic 02/05/2016   Hand, foot and mouth disease    has had in past   Sunburn 05/30/2012   WCC (well child check) 05/30/2012    Past Surgical History:  Procedure Laterality Date   ENDOSCOPIC INJECTION OF DEFLUX  23 yrs old   [place] SURGERY     [place]    Family History  Problem Relation Age of Onset   Hyperlipidemia Mother    Cancer Maternal Grandfather 68       lung, brain, abdomen   Hypertension Maternal Grandfather     Social History   Socioeconomic History   Marital status: Single    Spouse name: Not on file   Number of children: Not on file   Years of education: Not on file   Highest education level: Some college, no degree  Occupational History   Not on file  Tobacco Use   Smoking status: Never   Smokeless tobacco: Never  Vaping Use   Vaping status: Never Used  Substance and Sexual Activity   Alcohol use: Yes    Alcohol/week: 2.0 - 3.0 standard drinks of alcohol    Types: 2 - 3 Glasses of wine per week   Drug use: No   Sexual activity: Yes    Birth control/protection: Condom  Other Topics Concern   Not on file  Social History Narrative   Not on file   Social Drivers of Health   Financial Resource Strain: Low Risk  (09/08/2024)   Overall Financial Resource  Strain (CARDIA)    Difficulty of Paying Living Expenses: Not very hard  Food Insecurity: No Food Insecurity (09/08/2024)   Hunger Vital Sign    Worried About Running Out of Food in the Last Year: Never true    Ran Out of Food in the Last Year: Never true  Transportation Needs: No Transportation Needs (09/08/2024)   PRAPARE - Administrator, Civil Service (Medical): No    Lack of Transportation (Non-Medical): No  Physical Activity: Sufficiently Active (09/08/2024)   Exercise Vital Sign    Days of Exercise per Week: 6 days    Minutes of Exercise per Session: 40 min  Stress: Stress Concern Present (09/08/2024)   [employer] of Occupational Health - Occupational Stress Questionnaire    Feeling of Stress: To some extent  Social Connections: Moderately Integrated (09/08/2024)   Social Connection and Isolation Panel    Frequency of Communication with Friends and Family: More than three times a week    Frequency of Social Gatherings with Friends and Family: Once a week    Attends Religious Services: More than 4 times per year    Active Member of [employer] or Organizations: Yes  Attends Banker Meetings: More than 4 times per year    Marital Status: Never married  Intimate Partner Violence: Not on file    ROS All ROS negative except what is listed in the HPI.   Objective:   Today's Vitals: There were no vitals taken for this visit.  Physical Exam  Assessment & Plan:   Problem List Items Addressed This Visit   None     Follow-up: No follow-ups on file.   Waddell FURY Almarie, DNP, FNP-C  I,Emily Lagle,acting as a neurosurgeon for Waddell KATHEE Almarie, NP.,have documented all relevant documentation on the behalf of Waddell KATHEE Almarie, NP,as directed by  Waddell KATHEE Almarie, NP while in the presence of Waddell KATHEE Almarie, NP.   I, Waddell KATHEE Almarie, NP, have reviewed all documentation for this visit. The documentation on 10/05/2024 for the exam, diagnosis, procedures, and orders are all  accurate and complete.

## 2024-10-08 ENCOUNTER — Other Ambulatory Visit (HOSPITAL_COMMUNITY): Payer: Self-pay

## 2024-10-08 ENCOUNTER — Other Ambulatory Visit (HOSPITAL_BASED_OUTPATIENT_CLINIC_OR_DEPARTMENT_OTHER): Payer: Self-pay

## 2024-10-17 ENCOUNTER — Telehealth: Admitting: Family Medicine

## 2024-10-17 ENCOUNTER — Other Ambulatory Visit: Payer: Self-pay | Admitting: Family Medicine

## 2024-10-17 DIAGNOSIS — L309 Dermatitis, unspecified: Secondary | ICD-10-CM | POA: Diagnosis not present

## 2024-10-17 DIAGNOSIS — G4700 Insomnia, unspecified: Secondary | ICD-10-CM

## 2024-10-17 MED ORDER — CLOBETASOL PROPIONATE 0.05 % EX CREA
TOPICAL_CREAM | Freq: Two times a day (BID) | CUTANEOUS | 0 refills | Status: AC
Start: 2024-10-17 — End: 2024-10-31

## 2024-10-17 MED ORDER — TERBINAFINE HCL 1 % EX CREA: 1.0000 | TOPICAL_CREAM | Freq: Two times a day (BID) | CUTANEOUS | 0 refills | Status: AC

## 2024-10-17 NOTE — Patient Instructions (Signed)
 Erica Novak, thank you for joining Roosvelt Mater, PA-C for today's virtual visit.  While this provider is not your primary care provider (PCP), if your PCP is located in our provider database this encounter information will be shared with them immediately following your visit.   A [facility] MyChart account gives you access to today's visit and all your visits, tests, and labs performed at [facility]  click here if you don't have a [facility] MyChart account or go to [email or web address]  Consent: (Patient) Erica Novak provided verbal consent for this virtual visit at the beginning of the encounter.  Current Medications:  Current Outpatient Medications:    clobetasol cream (TEMOVATE) 0.05 %, Apply topically 2 (two) times daily for 14 days., Disp: 30 g, Rfl: 0   terbinafine (LAMISIL) 1 % cream, Apply 1 Application topically 2 (two) times daily for 15 days., Disp: 30 g, Rfl: 0   busPIRone  (BUSPAR ) 10 MG tablet, Take 1 tablet (10 mg total) by mouth 2 (two) times daily., Disp: 60 tablet, Rfl: 1   hydrOXYzine  (ATARAX ) 10 MG tablet, Take 1 tablet (10 mg total) by mouth at bedtime as needed for anxiety., Disp: 30 tablet, Rfl: 0   traZODone  (DESYREL ) 50 MG tablet, Take 1-2 tablets (50-100 mg total) by mouth at bedtime as needed for sleep., Disp: 30 tablet, Rfl: 1   Medications ordered in this encounter:  Meds ordered this encounter  Medications   clobetasol cream (TEMOVATE) 0.05 %    Sig: Apply topically 2 (two) times daily for 14 days.    Dispense:  30 g    Refill:  0   terbinafine (LAMISIL) 1 % cream    Sig: Apply 1 Application topically 2 (two) times daily for 15 days.    Dispense:  30 g    Refill:  0     *If you need refills on other medications prior to your next appointment, please contact your pharmacy*  Follow-Up: Call back or seek an in-person evaluation if the symptoms worsen or if the condition fails to improve as anticipated.  [facility] [phone]  Other Instructions Athlete's Foot  Athlete's foot (tinea pedis) is a fungal infection of the skin on your feet. It often occurs on the skin that is between or underneath your toes. It can also occur on the soles of your feet. Symptoms include itchy or white and flaky areas on the skin. The infection can spread from person to person (is contagious). It can also spread when a person's bare feet come in contact with the fungus on shower floors or on items such as shoes. Follow these instructions at home: Medicines Apply or take over-the-counter and prescription medicines only as told by your doctor. Apply your antifungal medicine as told by your doctor. Do not stop using it even if your feet start to get better. Foot care Do not scratch your feet. Keep your feet dry: Wear cotton or wool socks. Change your socks every day or if they become wet. Wear shoes that allow air to move around, such as sandals or canvas tennis shoes. Wash and dry your feet: Every day or as told by your doctor. After exercising. Including the area between your toes. General instructions Do not share any of these items that touch your feet: Towels. Shoes. Nail clippers. Other personal items. Protect your feet by wearing sandals in wet areas, such as locker rooms and shared showers. Keep all follow-up visits. If you have diabetes,  keep your blood sugar under control. Contact a doctor if: You have a fever. You have swelling, pain, warmth, or redness in your foot. Your feet are not getting better with treatment. Your symptoms get worse. You have new symptoms. You have very bad pain. Summary Athlete's foot is a fungal infection of the skin on your feet. This condition is caused by a fungus that grows in warm, moist places. Symptoms include itchy or white and flaky areas on the skin. Apply your antifungal medicine as told by your doctor. Keep your feet clean and dry. This information  is not intended to replace advice given to you by your health care provider. Make sure you discuss any questions you have with your health care provider. Document Revised: 03/12/2021 Document Reviewed: 03/12/2021 Elsevier Patient Education  2024 Elsevier Inc.   If you have been instructed to have an in-person evaluation today at a local Urgent Care facility, please use the link below. It will take you to a list of all of our available [facility], including address, phone number and hours of operation. Please do not delay care.  [facility]  If you or a family member do not have a primary care provider, use the link below to schedule a visit and establish care. When you choose a [facility] primary care physician or advanced practice provider, you gain a long-term partner in health. Find a Primary Care Provider  Learn more about [facility]'s in-office and virtual care options: [facility] - Get Care Now

## 2024-10-17 NOTE — Progress Notes (Signed)
 Virtual Visit Consent   Erica Novak, you are scheduled for a virtual visit with a [facility] provider today. Just as with appointments in the office, your consent must be obtained to participate. Your consent will be active for this visit and any virtual visit you may have with one of our providers in the next 365 days. If you have a MyChart account, a copy of this consent can be sent to you electronically.  As this is a virtual visit, video technology does not allow for your provider to perform a traditional examination. This may limit your provider's ability to fully assess your condition. If your provider identifies any concerns that need to be evaluated in person or the need to arrange testing (such as labs, EKG, etc.), we will make arrangements to do so. Although advances in technology are sophisticated, we cannot ensure that it will always work on either your end or our end. If the connection with a video visit is poor, the visit may have to be switched to a telephone visit. With either a video or telephone visit, we are not always able to ensure that we have a secure connection.  By engaging in this virtual visit, you consent to the provision of healthcare and authorize for your insurance to be billed (if applicable) for the services provided during this visit. Depending on your insurance coverage, you may receive a charge related to this service.  I need to obtain your verbal consent now. Are you willing to proceed with your visit today? [name] has provided verbal consent on 10/17/2024 for a virtual visit (video or telephone). Erica Novak, [place]  Date: 10/17/2024 2:12 PM   Virtual Visit via Video Note   I, Erica Novak, connected with  Erica Novak  ([id number], 07/02/2001) on 10/17/24 at  2:00 PM EST by a video-enabled telemedicine application and verified that I am speaking with the correct person using two identifiers.  Location: Patient: Virtual Visit  Location Patient: Home Provider: Virtual Visit Location Provider: Home Office   I discussed the limitations of evaluation and management by telemedicine and the availability of in person appointments. The patient expressed understanding and agreed to proceed.    History of Present Illness: Erica Novak is a 23 y.o. who identifies as a female who was assigned female at birth, and is being seen today for c/o having eczema her entire life.  Pt states two years ago she had a flare on her feet and they thought it was fungus but it was actually a flare.  Pt states she has another flare on her feet.  Pt states the current flare has been on going for 5-6 months. Pt states she saw her PCP a month ago and that was when they tried a different cream.   HPI: HPI  Problems:  Patient Active Problem List   Diagnosis Date Noted   Injury of collateral ligament of finger of right hand 07/10/2022   Iron deficiency anemia 04/18/2022   Family history of congenital heart defect 04/18/2022   Abdominal pain 04/21/2021   Bug bite of finger 08/14/2018   Acute bronchitis 11/06/2016   Asthma, chronic 02/05/2016   Allergic rhinitis 01/28/2015   WCC (well child check) 05/30/2012   Bilateral ureteral reflux 05/30/2012    Allergies:  Allergies  Allergen Reactions   Pear Itching   Penicillins Other (See Comments)    Doesn't react will to pcn   Sulfa Antibiotics    Medications:  Current Outpatient  Medications:    clobetasol cream (TEMOVATE) 0.05 %, Apply topically 2 (two) times daily for 14 days., Disp: 30 g, Rfl: 0   terbinafine (LAMISIL) 1 % cream, Apply 1 Application topically 2 (two) times daily for 15 days., Disp: 30 g, Rfl: 0   busPIRone  (BUSPAR ) 10 MG tablet, Take 1 tablet (10 mg total) by mouth 2 (two) times daily., Disp: 60 tablet, Rfl: 1   hydrOXYzine  (ATARAX ) 10 MG tablet, Take 1 tablet (10 mg total) by mouth at bedtime as needed for anxiety., Disp: 30 tablet, Rfl: 0   traZODone  (DESYREL ) 50 MG  tablet, Take 1-2 tablets (50-100 mg total) by mouth at bedtime as needed for sleep., Disp: 30 tablet, Rfl: 1  Observations/Objective: Patient is well-developed, well-nourished in no acute distress.  Resting comfortably at home.  Head is normocephalic, atraumatic.  No labored breathing.  Speech is clear and coherent with logical content.  Patient is alert and oriented at baseline.  Erythematous scaly and chapped skin noted to bilateral feet mostly on medial aspects of feet   Assessment and Plan: 1. Eczema, unspecified type (Primary) - clobetasol cream (TEMOVATE) 0.05 %; Apply topically 2 (two) times daily for 14 days.  Dispense: 30 g; Refill: 0 - terbinafine (LAMISIL) 1 % cream; Apply 1 Application topically 2 (two) times daily for 15 days.  Dispense: 30 g; Refill: 0  -Pt may have both a foot fungus and eczema flare -Advised Pt to moisturize and will prescribe different steroid cream and anti fungal cream  -Advised Pt to F/U with PCP for dermatology referral and if worsening symptoms to proceed to emergency room in person or in person urgent care.   Follow Up Instructions: I discussed the assessment and treatment plan with the patient. The patient was provided an opportunity to ask questions and all were answered. The patient agreed with the plan and demonstrated an understanding of the instructions.  A copy of instructions were sent to the patient via MyChart unless otherwise noted below.    The patient was advised to call back or seek an in-person evaluation if the symptoms worsen or if the condition fails to improve as anticipated.    Erica Mater, PA-C

## 2024-10-27 ENCOUNTER — Ambulatory Visit: Payer: Self-pay

## 2024-10-27 DIAGNOSIS — S61311A Laceration without foreign body of left index finger with damage to nail, initial encounter: Secondary | ICD-10-CM | POA: Diagnosis not present

## 2024-10-27 DIAGNOSIS — L239 Allergic contact dermatitis, unspecified cause: Secondary | ICD-10-CM | POA: Diagnosis not present

## 2024-10-27 NOTE — Telephone Encounter (Signed)
 FYI Only or Action Required?: FYI only for provider: Urgent Care.  Patient was last seen in primary care on 10/17/2024 by Kingston Robes, PA-C.  Called Nurse Triage reporting Allergic Reaction.  Symptoms began several days ago.  Interventions attempted: OTC medications: Aquaphor and antibiotic ointment.  Symptoms are: gradually worsening.  Triage Disposition: See Physician Within 24 Hours  Patient/caregiver understands and will follow disposition?: YesCopied from . Reason for Disposition  [1] Looks infected (e.g., spreading redness, pus) AND [2] no fever  Answer Assessment - Initial Assessment Questions Unable to schedule appt in office due to patient's work schedule-UC advised   1. APPEARANCE of RASH: What does the rash look like? (e.g., blisters, dry flaky skin, red spots, redness, sores)     Red spots all over arm  2. LOCATION: Where is the rash located?      Left arm  3. NUMBER: How many spots are there?      Multiple-rash has spread since starting on Friday   4. SIZE: How big are the spots? (e.g., inches, cm; or compare to size of pinhead, tip of pen, eraser, pea)      Some are pinhead, some bigger bigger spot that's mostly red  5. ONSET: When did the rash start?      Friday morning   6. ITCHING: Does the rash itch? If Yes, ask: How bad is the itch?  (Scale 0-10; or none, mild, moderate, severe)     9/10 at night, currently 2-3/10  7. PAIN: Does the rash hurt? If Yes, ask: How bad is the pain?  (Scale 0-10; or none, mild, moderate, severe)     5-6/10 when waking up in the morning, currently no pain  8. OTHER SYMPTOMS: Do you have any other symptoms? (e.g., fever)     No  9. PREGNANCY: Is there any chance you are pregnant? When was your last menstrual period?     No  Protocols used: Rash or Redness - Localized-A-AH  CRM [id number]. Topic: Clinical - Red Word Triage >> Oct 27, 2024 10:55 AM Eva FALCON wrote: Red Word that prompted transfer  to Nurse Triage: Got a tattoo 11/20. Thinks she's having an allergic reaction to ink, red bumps that has been moving up arm and causing itch, is wondering if she were to start Xyzal  would it interfere with her Buspar  or any other medications she is taking

## 2024-10-27 NOTE — Telephone Encounter (Signed)
 Pt going to [place]

## 2024-11-13 ENCOUNTER — Other Ambulatory Visit: Payer: Self-pay | Admitting: Family Medicine

## 2024-11-13 DIAGNOSIS — F419 Anxiety disorder, unspecified: Secondary | ICD-10-CM

## 2024-11-13 NOTE — Telephone Encounter (Signed)
 Copied from CRM #[id number]. Topic: Clinical - Medication Refill >> Nov 13, 2024  1:04 PM Jayma L wrote: Medication:  busPIRone  (BUSPAR ) 10 MG tablet    Has the patient contacted their pharmacy? Yes (Agent: If no, request that the patient contact the pharmacy for the refill. If patient does not wish to contact the pharmacy document the reason why and proceed with request.) (Agent: If yes, when and what did the pharmacy advise?)  This is the patient's preferred pharmacy:  [id number] - [place], [place] - [address] [place] [address] Phone: [phone] Fax: [phone]  Is this the correct pharmacy for this prescription? Yes If no, delete pharmacy and type the correct one.   Has the prescription been filled recently? No  Is the patient out of the medication? No  Has the patient been seen for an appointment in the last year OR does the patient have an upcoming appointment? Yes  Can we respond through MyChart? Yes  Agent: Please be advised that Rx refills may take up to 3 business days. We ask that you follow-up with your pharmacy.

## 2024-11-16 ENCOUNTER — Telehealth: Admitting: Family Medicine

## 2024-11-16 DIAGNOSIS — F32A Depression, unspecified: Secondary | ICD-10-CM

## 2024-11-16 DIAGNOSIS — F419 Anxiety disorder, unspecified: Secondary | ICD-10-CM | POA: Insufficient documentation

## 2024-11-16 MED ORDER — BUSPIRONE HCL 10 MG PO TABS: 10.0000 mg | ORAL_TABLET | Freq: Two times a day (BID) | ORAL | 1 refills | Status: DC

## 2024-11-16 MED ORDER — BUPROPION HCL ER (XL) 150 MG PO TB24: 150.0000 mg | ORAL_TABLET | Freq: Every day | ORAL | 1 refills | Status: DC

## 2024-11-16 NOTE — Assessment & Plan Note (Addendum)
 Increased irritability may be mood-related. Discussed serotonin syndrome risk with SSRIs while on Buspar . Considered Wellbutrin  for mood and fatigue. - Continue Buspirone  7.5 mg oral bid. Discussed finding a good schedule and trying to take with at least a small snack.  - Added Wellbutrin  at a low dose once daily. - Follow up after 6-8 weeks to assess effectiveness and adjust treatment.

## 2024-11-16 NOTE — Progress Notes (Signed)
 Virtual Video Visit via MyChart Note  I connected with  Trinitie A Grabe on 11/16/2024 at  4:00 PM EST by the video enabled telemedicine application for MyChart, and verified that I am speaking with the correct person using two identifiers.   I introduced myself as a Publishing Rights Manager with the practice. We discussed the limitations of evaluation and management by telemedicine and the availability of in person appointments. The patient expressed understanding and agreed to proceed.  Participating parties in this visit include: The patient and the nurse practitioner listed.  The patient is: At home I am: In the office -  Primary Care at [facility]  Subjective:    CC:  Chief Complaint  Patient presents with   Medical Management of Chronic Issues    HPI: Erica Novak is a 23 y.o. year old female presenting today via MyChart today for anxiety.  Discussed the use of AI scribe software for clinical note transcription with the patient, who gave verbal consent to proceed.  History of Present Illness Erica Novak is a 23 year old female who presents with irritability and mood changes.  She experiences significant irritability, affecting her interactions with coworkers. She describes feeling 'extra, extra irritable' and notes that minor annoyances cause intense reactions. This sensation is likened to premenstrual symptoms but is constant.  She denies any thoughts of self-harm and does not feel hopeless. She acknowledges feeling tired due to working twelve-hour shifts daily. She has not been using trazodone  for sleep this month because she has been exhausted from work, but plans to resume it after December when her work schedule eases if needed.  She is currently taking Buspar  for anxiety, which she feels works well. She takes her first dose around noon, as she cannot eat in the morning, and often misses her second dose at bedtime due to sleep. Missing the  bedtime dose results in worsened symptoms the following day.  She has a history of not being able to eat upon waking, which has been a lifelong issue, causing nausea if she eats too soon after waking. She does not consume any beverages like coffee or juice in the morning.  She is not pregnant or breastfeeding.     Mood follow-up: - Diagnosis: anxiety - Treatment: Buspirone  5 mg twice daily, trazodone  25-50 mg as needed (not needed recently) - Medication side effects: none - SI/HI: no - Update: Anxiety is better controlled, but she feels more irritable over things that usually wouldn't bother her.          11/16/2024    3:44 PM 09/08/2024   12:00 PM 08/06/2024    2:20 PM 04/22/2023    1:35 PM 10/24/2022    2:09 PM  Depression screen PHQ 2/9  Decreased Interest 0 1 1 0 0  Down, Depressed, Hopeless 0 0 1 0 2  PHQ - 2 Score 0 1 2 0 2  Altered sleeping 3 3 2 1  0  Tired, decreased energy 3 2 3 3 1   Change in appetite 1 1 3 2 1   Feeling bad or failure about yourself  0 0 1 0 0  Trouble concentrating 1 0 2 0 0  Moving slowly or fidgety/restless 2 0 2 1 0  Suicidal thoughts 0 0 1 0 0  PHQ-9 Score 10 7  16  7  4    Difficult doing work/chores Very difficult Somewhat difficult Somewhat difficult Somewhat difficult Not difficult at all     Data saved with  a previous flowsheet row definition      11/16/2024    3:46 PM 09/08/2024   12:00 PM 08/06/2024    2:20 PM 04/22/2023    1:35 PM  GAD 7 : Generalized Anxiety Score  Nervous, Anxious, on Edge 3 2 3 1   Control/stop worrying 1 2 3 1   Worry too much - different things 1 2 2 1   Trouble relaxing 3 0 2 1  Restless 2 2 2  0  Easily annoyed or irritable 3 0 1 2  Afraid - awful might happen 0 0 2 0  Total GAD 7 Score 13 8 15 6   Anxiety Difficulty Very difficult Somewhat difficult Very difficult Not difficult at all     Past medical history, Surgical history, Family history not pertinant except as noted below, Social history, Allergies,  and medications have been entered into the medical record, reviewed, and corrections made.   Review of Systems:  All review of systems negative except what is listed in the HPI   Objective:    General:  Speaking clearly in complete sentences. Absent shortness of breath noted.   Alert and oriented x3.   Normal judgment.  Absent acute distress.   Impression and Recommendations:    Problem List Items Addressed This Visit       Active Problems   Anxiety and depression - Primary   Increased irritability may be mood-related. Discussed serotonin syndrome risk with SSRIs while on Buspar . Considered Wellbutrin  for mood and fatigue. - Continue Buspirone  7.5 mg oral bid. Discussed finding a good schedule and trying to take with at least a small snack.  - Added Wellbutrin  at a low dose once daily. - Follow up after 6-8 weeks to assess effectiveness and adjust treatment.      Relevant Medications   buPROPion  (WELLBUTRIN  XL) 150 MG 24 hr tablet     Follow-up in 6 weeks for mood or sooner if needed.       I discussed the assessment and treatment plan with the patient. The patient was provided an opportunity to ask questions and all were answered. The patient agreed with the plan and demonstrated an understanding of the instructions.   The patient was advised to call back or seek an in-person evaluation if the symptoms worsen or if the condition fails to improve as anticipated.   Waddell KATHEE Mon, NP  I,Emily Lagle,acting as a scribe for Waddell KATHEE Mon, NP.,have documented all relevant documentation on the behalf of Waddell KATHEE Mon, NP.  I, Waddell KATHEE Mon, NP, have reviewed all documentation for this visit. The documentation on 11/16/2024 for the exam, diagnosis, procedures, and orders are all accurate and complete.

## 2024-11-19 ENCOUNTER — Encounter: Payer: Self-pay | Admitting: Family Medicine

## 2024-11-23 NOTE — Telephone Encounter (Signed)
 Copied from CRM #[id number]. Topic: Clinical - Request for Lab/Test Order >> Nov 23, 2024 10:53 AM Victoria A wrote: Reason for CRM: Patient said Primary told her to have labs performed- Order for labs please

## 2024-11-23 NOTE — Telephone Encounter (Signed)
 Copied from CRM #[id number]. Topic: Clinical - Medical Advice >> Nov 23, 2024 10:54 AM Victoria A wrote: Reason for CRM: Patient said that Wellbutrin  has caused her to have more depression and she stopped taking it due to side effects-please contact [phone]

## 2024-11-23 NOTE — Telephone Encounter (Signed)
 See notes, stopped Wellbutrin .   I don't see where you discussed having labs, please advise.

## 2024-11-29 NOTE — Progress Notes (Signed)
 "  Acute Office Visit  Subjective:  Patient ID: Erica Novak, female    DOB: 06-26-2001  Age: 23 y.o. MRN: [id number]  CC: No chief complaint on file.     HPI Erica Novak is here to discuss side effects of Wellbutrin .    Mood follow-up: - Diagnosis: Anxiety/Depression - Treatment: Wellbutrin  150 mg daily, Buspirone  10 mg BID, and Trazodone  25-50 mg as needed.  - Medication side effects: with Wellbutrin : blisters (inside of the mouth and as well as the arm) - pop on their own and then go away, dont turn into sores.   - SI/HI:  - Update:     Past Medical History:  Diagnosis Date   Allergy    Anemia    Anxiety    Asthma, chronic 02/05/2016   Hand, foot and mouth disease    has had in past   Sunburn 05/30/2012   WCC (well child check) 05/30/2012    Past Surgical History:  Procedure Laterality Date   ENDOSCOPIC INJECTION OF DEFLUX  23 yrs old   [place]    Family History  Problem Relation Age of Onset   Hyperlipidemia Mother    Cancer Maternal Grandfather 9       lung, brain, abdomen   Hypertension Maternal Grandfather     Social History   Socioeconomic History   Marital status: Single    Spouse name: Not on file   Number of children: Not on file   Years of education: Not on file   Highest education level: Some college, no degree  Occupational History   Not on file  Tobacco Use   Smoking status: Never   Smokeless tobacco: Never  Vaping Use   Vaping status: Never Used  Substance and Sexual Activity   Alcohol use: Yes    Alcohol/week: 2.0 - 3.0 standard drinks of alcohol    Types: 2 - 3 Glasses of wine per week   Drug use: No   Sexual activity: Yes    Birth control/protection: Condom  Other Topics Concern   Not on file  Social History Narrative   Not on file   Social Drivers of Health   Tobacco Use: Low Risk (11/16/2024)   Patient History    Smoking Tobacco Use: Never    Smokeless Tobacco Use: Never     Passive Exposure: Not on file  Financial Resource Strain: Low Risk (11/16/2024)   Overall Financial Resource Strain (CARDIA)    Difficulty of Paying Living Expenses: Not very hard  Food Insecurity: No Food Insecurity (11/16/2024)   Epic    Worried About Radiation Protection Practitioner of Food in the Last Year: Never true    Ran Out of Food in the Last Year: Never true  Transportation Needs: No Transportation Needs (11/16/2024)   Epic    Lack of Transportation (Medical): No    Lack of Transportation (Non-Medical): No  Physical Activity: Sufficiently Active (11/16/2024)   Exercise Vital Sign    Days of Exercise per Week: 5 days    Minutes of Exercise per Session: 40 min  Stress: Stress Concern Present (11/16/2024)   [employer] of Occupational Health - Occupational Stress Questionnaire    Feeling of Stress: Rather much  Social Connections: Moderately Isolated (11/16/2024)   Social Connection and Isolation Panel    Frequency of Communication with Friends and Family: More than three times a week    Frequency of Social Gatherings with Friends and Family: Once a  week    Attends Religious Services: More than 4 times per year    Active Member of Clubs or Organizations: No    Attends Banker Meetings: Not on file    Marital Status: Never married  Intimate Partner Violence: Not on file  Depression (PHQ2-9): Medium Risk (11/16/2024)   Depression (PHQ2-9)    PHQ-2 Score: 10  Alcohol Screen: Low Risk (11/16/2024)   Alcohol Screen    Last Alcohol Screening Score (AUDIT): 3  Housing: Low Risk (11/16/2024)   Epic    Unable to Pay for Housing in the Last Year: No    Number of Times Moved in the Last Year: 0    Homeless in the Last Year: No  Recent Concern: Housing - High Risk (09/08/2024)   Epic    Unable to Pay for Housing in the Last Year: Yes    Number of Times Moved in the Last Year: 0    Homeless in the Last Year: No  Utilities: Not on file  Health Literacy: Not on file    ROS All  ROS negative except what is listed in the HPI.   Objective:   Today's Vitals: There were no vitals taken for this visit.  Physical Exam  Assessment & Plan:   Problem List Items Addressed This Visit   None     Follow-up: No follow-ups on file.   Erica FURY Almarie, DNP, FNP-C  I,Erica Novak,acting as a neurosurgeon for Erica KATHEE Almarie, NP.,have documented all relevant documentation on the behalf of Erica KATHEE Almarie, NP.   I, Erica KATHEE Almarie, NP, have reviewed all documentation for this visit. The documentation on 11/30/2024 for the exam, diagnosis, procedures, and orders are all accurate and complete. "

## 2024-11-30 ENCOUNTER — Encounter: Payer: Self-pay | Admitting: Family Medicine

## 2024-11-30 ENCOUNTER — Other Ambulatory Visit (HOSPITAL_BASED_OUTPATIENT_CLINIC_OR_DEPARTMENT_OTHER): Payer: Self-pay

## 2024-11-30 ENCOUNTER — Ambulatory Visit: Admitting: Family Medicine

## 2024-11-30 VITALS — BP 113/72 | HR 86 | Ht 69.0 in | Wt 144.0 lb

## 2024-11-30 DIAGNOSIS — N939 Abnormal uterine and vaginal bleeding, unspecified: Secondary | ICD-10-CM

## 2024-11-30 DIAGNOSIS — N926 Irregular menstruation, unspecified: Secondary | ICD-10-CM | POA: Diagnosis not present

## 2024-11-30 DIAGNOSIS — F419 Anxiety disorder, unspecified: Secondary | ICD-10-CM | POA: Diagnosis not present

## 2024-11-30 DIAGNOSIS — R5383 Other fatigue: Secondary | ICD-10-CM | POA: Diagnosis not present

## 2024-11-30 LAB — IBC + FERRITIN
Ferritin: 18.6 ng/mL (ref 10.0–291.0)
Iron: 209 ug/dL — ABNORMAL HIGH (ref 42–145)
Saturation Ratios: 53.9 % — ABNORMAL HIGH (ref 20.0–50.0)
TIBC: 387.8 ug/dL (ref 250.0–450.0)
Transferrin: 277 mg/dL (ref 212.0–360.0)

## 2024-11-30 LAB — COMPREHENSIVE METABOLIC PANEL WITH GFR
ALT: 13 U/L (ref 3–35)
AST: 15 U/L (ref 5–37)
Albumin: 5.2 g/dL (ref 3.5–5.2)
Alkaline Phosphatase: 43 U/L (ref 39–117)
BUN: 14 mg/dL (ref 6–23)
CO2: 30 meq/L (ref 19–32)
Calcium: 10.1 mg/dL (ref 8.4–10.5)
Chloride: 100 meq/L (ref 96–112)
Creatinine, Ser: 1.01 mg/dL (ref 0.40–1.20)
GFR: 79.58 mL/min
Glucose, Bld: 96 mg/dL (ref 70–99)
Potassium: 4.6 meq/L (ref 3.5–5.1)
Sodium: 137 meq/L (ref 135–145)
Total Bilirubin: 0.8 mg/dL (ref 0.2–1.2)
Total Protein: 7.6 g/dL (ref 6.0–8.3)

## 2024-11-30 LAB — TESTOSTERONE: Testosterone: 56.08 ng/dL — ABNORMAL HIGH (ref 15.00–40.00)

## 2024-11-30 LAB — CBC WITH DIFFERENTIAL/PLATELET
Basophils Absolute: 0 K/uL (ref 0.0–0.1)
Basophils Relative: 0.7 % (ref 0.0–3.0)
Eosinophils Absolute: 0.1 K/uL (ref 0.0–0.7)
Eosinophils Relative: 1.5 % (ref 0.0–5.0)
HCT: 42.6 % (ref 36.0–46.0)
Hemoglobin: 14.3 g/dL (ref 12.0–15.0)
Lymphocytes Relative: 29.1 % (ref 12.0–46.0)
Lymphs Abs: 1.5 K/uL (ref 0.7–4.0)
MCHC: 33.5 g/dL (ref 30.0–36.0)
MCV: 91.9 fl (ref 78.0–100.0)
Monocytes Absolute: 0.4 K/uL (ref 0.1–1.0)
Monocytes Relative: 7.6 % (ref 3.0–12.0)
Neutro Abs: 3.1 K/uL (ref 1.4–7.7)
Neutrophils Relative %: 61.1 % (ref 43.0–77.0)
Platelets: 231 K/uL (ref 150.0–400.0)
RBC: 4.64 Mil/uL (ref 3.87–5.11)
RDW: 13.3 % (ref 11.5–15.5)
WBC: 5.1 K/uL (ref 4.0–10.5)

## 2024-11-30 LAB — TSH: TSH: 0.6 u[IU]/mL (ref 0.35–5.50)

## 2024-11-30 LAB — HCG, QUANTITATIVE, PREGNANCY: Quantitative HCG: 0.75 m[IU]/mL

## 2024-11-30 LAB — B12 AND FOLATE PANEL
Folate: 11.3 ng/mL
Vitamin B-12: 326 pg/mL (ref 211–911)

## 2024-11-30 LAB — LUTEINIZING HORMONE: LH: 30.02 m[IU]/mL

## 2024-11-30 LAB — FOLLICLE STIMULATING HORMONE: FSH: 7.1 m[IU]/mL

## 2024-11-30 LAB — POCT URINE PREGNANCY: Preg Test, Ur: NEGATIVE

## 2024-11-30 MED ORDER — BUSPIRONE HCL 10 MG PO TABS
10.0000 mg | ORAL_TABLET | Freq: Three times a day (TID) | ORAL | 0 refills | Status: AC
  Filled 2024-11-30: qty 90, 30d supply, fill #0
  Filled 2024-11-30: qty 270, 90d supply, fill #0
  Filled 2024-12-01: qty 90, 30d supply, fill #0

## 2024-12-01 ENCOUNTER — Encounter: Payer: Self-pay | Admitting: Family Medicine

## 2024-12-01 ENCOUNTER — Other Ambulatory Visit (HOSPITAL_BASED_OUTPATIENT_CLINIC_OR_DEPARTMENT_OTHER): Payer: Self-pay

## 2024-12-04 ENCOUNTER — Ambulatory Visit: Payer: Self-pay | Admitting: Family Medicine

## 2024-12-04 DIAGNOSIS — N939 Abnormal uterine and vaginal bleeding, unspecified: Secondary | ICD-10-CM | POA: Insufficient documentation

## 2024-12-04 LAB — PROLACTIN: Prolactin: 8 ng/mL

## 2024-12-04 LAB — DHEA-SULFATE: DHEA-SO4: 140 ug/dL (ref 44–286)

## 2024-12-04 LAB — 17-HYDROXYPROGESTERONE: 17-OH-Progesterone, LC/MS/MS: 129 ng/dL

## 2024-12-04 LAB — ESTRADIOL: Estradiol: 57 pg/mL

## 2024-12-07 ENCOUNTER — Ambulatory Visit: Admitting: Family Medicine

## 2024-12-07 DIAGNOSIS — E282 Polycystic ovarian syndrome: Secondary | ICD-10-CM | POA: Insufficient documentation

## 2024-12-07 NOTE — Progress Notes (Incomplete)
 "  Established Patient Office Visit  Subjective:  Patient ID: Erica Novak, female    DOB: 2001-08-22  Age: 24 y.o. MRN: [id number]  CC: No chief complaint on file.     HPI Erica Novak is here for follow up from 11/30/2024.       Abnormal Menses: - History of abnormal bleeding, most recently with her first period in November starting on the 2nd and ending on the 7th. She experienced heavy bleeding on November 22nd, which included large clots and lasted one day, followed by light spotting. Another episode began on November 30th and lasted until December 3rd, also characterized by heavy bleeding. The most recent episode started on December 16th and lasted until the 23rd, noted as the heaviest and longest, requiring frequent emptying of her menstrual cup. She is not on any birth control and has not been sexually active for a year until November 28th. She had been resulting negative on pregnancy tests.  - She also complained on significant fatigue.      Latest Ref Rng & Units 11/30/2024    2:33 PM 05/18/2024    2:23 PM 04/22/2023    1:40 PM  CBC  WBC 4.0 - 10.5 K/uL 5.1  7.3  5.9   Hemoglobin 12.0 - 15.0 g/dL 85.6  86.5  86.5   Hematocrit 36.0 - 46.0 % 42.6  41.4  40.9   Platelets 150.0 - 400.0 K/uL 231.0  239  245.0       Past Medical History:  Diagnosis Date   Allergy    Anemia    Anxiety    Asthma, chronic 02/05/2016   Hand, foot and mouth disease    has had in past   Sunburn 05/30/2012   WCC (well child check) 05/30/2012    Past Surgical History:  Procedure Laterality Date   ENDOSCOPIC INJECTION OF DEFLUX  24 yrs old   [place]    Family History  Problem Relation Age of Onset   Hyperlipidemia Mother    Cancer Maternal Grandfather 64       lung, brain, abdomen   Hypertension Maternal Grandfather     Social History   Socioeconomic History   Marital status: Single    Spouse name: Not on file   Number of children: Not on file    Years of education: Not on file   Highest education level: Some college, no degree  Occupational History   Not on file  Tobacco Use   Smoking status: Never   Smokeless tobacco: Never  Vaping Use   Vaping status: Never Used  Substance and Sexual Activity   Alcohol use: Yes    Alcohol/week: 2.0 - 3.0 standard drinks of alcohol    Types: 2 - 3 Glasses of wine per week   Drug use: No   Sexual activity: Yes    Birth control/protection: Condom  Other Topics Concern   Not on file  Social History Narrative   Not on file   Social Drivers of Health   Tobacco Use: Low Risk (11/30/2024)   Patient History    Smoking Tobacco Use: Never    Smokeless Tobacco Use: Never    Passive Exposure: Not on file  Financial Resource Strain: Low Risk (11/16/2024)   Overall Financial Resource Strain (CARDIA)    Difficulty of Paying Living Expenses: Not very hard  Food Insecurity: Low Risk (12/01/2024)   Received from [facility]   Epic    Within  the past 12 months, you worried that your food would run out before you got money to buy more: Never true    Within the past 12 months, the food you bought just didn't last and you didn't have money to get more. : Never true  Transportation Needs: No Transportation Needs (12/01/2024)   Received from [facility]    In the past 12 months, has lack of reliable transportation kept you from medical appointments, meetings, work or from getting things needed for daily living? : No  Physical Activity: Sufficiently Active (11/16/2024)   Exercise Vital Sign    Days of Exercise per Week: 5 days    Minutes of Exercise per Session: 40 min  Stress: Stress Concern Present (11/16/2024)   [employer] of Occupational Health - Occupational Stress Questionnaire    Feeling of Stress: Rather much  Social Connections: Moderately Isolated (11/16/2024)   Social Connection and Isolation Panel    Frequency of Communication with Friends and Family: More  than three times a week    Frequency of Social Gatherings with Friends and Family: Once a week    Attends Religious Services: More than 4 times per year    Active Member of [employer] or Organizations: No    Attends Banker Meetings: Not on file    Marital Status: Never married  Intimate Partner Violence: Not on file  Depression (PHQ2-9): High Risk (11/30/2024)   Depression (PHQ2-9)    PHQ-2 Score: 13  Alcohol Screen: Low Risk (11/16/2024)   Alcohol Screen    Last Alcohol Screening Score (AUDIT): 3  Housing: Low Risk (12/01/2024)   Received from [facility]   Epic    What is your living situation today?: I have a steady place to live    Think about the place you live. Do you have problems with any of the following? Choose all that apply:: None/None on this list  Recent Concern: Housing - High Risk (09/08/2024)   Epic    Unable to Pay for Housing in the Last Year: Yes    Number of Times Moved in the Last Year: 0    Homeless in the Last Year: No  Utilities: Low Risk (12/01/2024)   Received from [facility]   Utilities    In the past 12 months has the electric, gas, oil, or water company threatened to shut off services in your home? : No  Health Literacy: Not on file    ROS All ROS negative except what is listed in the HPI.   Objective:   Today's Vitals: There were no vitals taken for this visit.  Physical Exam  Assessment & Plan:   Problem List Items Addressed This Visit   None     Follow-up: No follow-ups on file.   Erica FURY Almarie, DNP, FNP-C  I,Emily Lagle,acting as a neurosurgeon for Erica KATHEE Almarie, NP.,have documented all relevant documentation on the behalf of Erica KATHEE Almarie, NP.  I, Erica KATHEE Almarie, NP, have reviewed all documentation for this visit. The documentation on 12/07/2024 for the exam, diagnosis, procedures, and orders are all accurate and complete. "

## 2024-12-08 ENCOUNTER — Ambulatory Visit: Payer: Self-pay

## 2024-12-08 NOTE — Telephone Encounter (Signed)
 FYI Only or Action Required?: FYI only for provider: appointment scheduled on 12/09/24.  Patient was last seen in primary care on 11/30/2024 by Almarie Waddell NOVAK, NP.  Called Nurse Triage reporting Fatigue.  Symptoms began several days ago.  Interventions attempted: Nothing.  Symptoms are: unchanged.  Triage Disposition: See PCP When Office is Open (Within 3 Days)  Patient/caregiver understands and will follow disposition?: Yes  Reason for Disposition  [1] Fatigue (i.e., tires easily, decreased energy) AND [2] persists > 1 week  Answer Assessment - Initial Assessment Questions Patient states that she has been experiencing fatigue starting this week. She is having a hard time getting out of bed and feels very tired all the time. She also reports having difficulty going to sleep. Office visit advised. Patient is also inquiring if she should take Provera in reference to PCOS, she states she stopped bleeding today and isn't sure if she should take it.   1. DESCRIPTION: Describe how you are feeling.     Hard time staying awake, lack of energy  2. SEVERITY: How bad is it?  Can you stand and walk?     Mild, able to walk  3. ONSET: When did these symptoms begin? (e.g., hours, days, weeks, months)     This week  4. CAUSE: What do you think is causing the weakness or fatigue? (e.g., not drinking enough fluids, medical problem, trouble sleeping)     Unsure  5. NEW MEDICINES:  Have you started on any new medicines recently? (e.g., opioid pain medicines, benzodiazepines, muscle relaxants, antidepressants, antihistamines, neuroleptics, beta blockers)     States she started Wellbutrin  a couple weeks ago but only took it for 4 days and stopped it  6. OTHER SYMPTOMS: Do you have any other symptoms? (e.g., chest pain, fever, cough, SOB, vomiting, diarrhea, bleeding, other areas of pain)     Denies any other symptoms  7. PREGNANCY: Is there any chance you are pregnant? When was your  last menstrual period?     No  Protocols used: Weakness (Generalized) and Fatigue-A-AH  Copied from CRM #[id number]. Topic: Clinical - Red Word Triage >> Dec 08, 2024  2:45 PM Rea ORN wrote: Red Word that prompted transfer to Nurse Triage: extreme fatigue

## 2024-12-08 NOTE — Telephone Encounter (Signed)
 Patient was scheduled with Harlene GRADE, NP for a visit. Patient just seen 11/30/2024 and evaluated for fatigue. Had follow up yesterday that she cancelled. Please advise.

## 2024-12-09 ENCOUNTER — Encounter: Payer: Self-pay | Admitting: Student

## 2024-12-09 ENCOUNTER — Ambulatory Visit: Admitting: Student

## 2024-12-09 DIAGNOSIS — Z91199 Patient's noncompliance with other medical treatment and regimen due to unspecified reason: Secondary | ICD-10-CM

## 2024-12-09 DIAGNOSIS — R5383 Other fatigue: Secondary | ICD-10-CM | POA: Insufficient documentation

## 2024-12-09 DIAGNOSIS — D509 Iron deficiency anemia, unspecified: Secondary | ICD-10-CM

## 2024-12-09 NOTE — Telephone Encounter (Signed)
"  Patient made aware  "

## 2024-12-09 NOTE — Progress Notes (Signed)
" ° ° °  ERROR--PT NO SHOW      Acute Office Visit  Subjective:     Patient ID: Erica Novak, female    DOB: 2001/11/21, 24 y.o.   MRN: [id number]  No chief complaint on file.   HPI Patient reports fatigue that began earlier this week. Describes feeling very tired, having difficulty staying awake, and experiencing low energy, with trouble getting out of bed. Fatigue has been persistent without improvement. She also reports difficulty falling asleep. Severity described as mild; she is able to walk and perform basic activities. Denies chest pain, shortness of breath, fever, cough, gastrointestinal symptoms, bleeding, or other associated symptoms. No clear triggering event identified. She recently started Wellbutrin  a couple of weeks ago but discontinued it after 4 days. She has PCOS and is inquiring whether she should take Provera; bleeding reportedly stopped today. No interventions attempted. Office visit scheduled for evaluation.  ROS      Objective:    There were no vitals taken for this visit.   Physical Exam  No results found for any visits on 12/09/24.      Assessment & Plan:   Problem List Items Addressed This Visit       Other   Other fatigue - Primary    No orders of the defined types were placed in this encounter.   No follow-ups on file.  Harlene LITTIE Jolly, NP   "

## 2024-12-10 ENCOUNTER — Encounter: Payer: Self-pay | Admitting: Family Medicine

## 2024-12-10 ENCOUNTER — Ambulatory Visit (INDEPENDENT_AMBULATORY_CARE_PROVIDER_SITE_OTHER): Admitting: Family Medicine

## 2024-12-10 ENCOUNTER — Other Ambulatory Visit (HOSPITAL_BASED_OUTPATIENT_CLINIC_OR_DEPARTMENT_OTHER): Payer: Self-pay

## 2024-12-10 VITALS — BP 133/72 | HR 77 | Ht 69.0 in | Wt 149.0 lb

## 2024-12-10 DIAGNOSIS — D509 Iron deficiency anemia, unspecified: Secondary | ICD-10-CM

## 2024-12-10 DIAGNOSIS — R21 Rash and other nonspecific skin eruption: Secondary | ICD-10-CM | POA: Diagnosis not present

## 2024-12-10 DIAGNOSIS — R5383 Other fatigue: Secondary | ICD-10-CM | POA: Diagnosis not present

## 2024-12-10 MED ORDER — CLOBETASOL PROPIONATE 0.05 % EX CREA: 1.0000 | TOPICAL_CREAM | Freq: Two times a day (BID) | CUTANEOUS | 0 refills | Status: AC

## 2024-12-10 NOTE — Progress Notes (Signed)
 "  Established Patient Office Visit  Subjective:  Patient ID: Erica Novak, female    DOB: 08-Feb-2001  Age: 24 y.o. MRN: [id number]  CC:  Chief Complaint  Patient presents with   Medical Management of Chronic Issues      HPI Erica Novak is here for follow up from 11/30/2024.    Abnormal Menses: - History of abnormal bleeding, most recently with her first period in November starting on the 2nd and ending on the 7th. She experienced heavy bleeding on November 22nd, which included large clots and lasted one day, followed by light spotting. Another episode began on November 30th and lasted until December 3rd, also characterized by heavy bleeding. Another episode started on December 16th and lasted until the 23rd and the most recent started on December 29th and lasted until yesterday (January 7) - noted as the heaviest. She is not on any birth control and has not been sexually active for a year until November 28th.    Discussed the use of AI scribe software for clinical note transcription with the patient, who gave verbal consent to proceed.  History of Present Illness Erica Novak is a 24 year old female who presents with prolonged menstrual bleeding and fatigue.  She has been experiencing recurrent prolonged menstrual bleeding and has consulted with GYN. She was prescribed Provera but has not started taking it due to concerns about potential side effects. She has a history of irregular periods and was previously advised to take birth control at age 62, which she did not due to family history concerns.  She reports significant fatigue, stating she has been unable to get out of bed until late afternoon and has no energy. She mentions working fewer hours in January compared to December due to decreased workload. She reports that her recent blood work included a hemoglobin of 14.3 and iron levels that were reported as slightly elevated. No dizziness is reported, but she  feels extremely tired. Labs were stable prior to most recent heavy bleeding, so she would like to see if still stable.   She has a history of high testosterone  levels and is being evaluated for polycystic ovary syndrome (PCOS). An ultrasound is scheduled for December 16, 2024, to further investigate. She has undergone a full hormone panel, which was mostly normal except for elevated testosterone . STD screenings, including HIV, hep B, and hep C, were negative.  She has a history of rash on her feet. She previously used clobetasol , which provided temporary relief, and received a Kenalog  injection during a severe flare-up. 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 She has an upcoming podiatry appointment, but is wondering if she should get dermatology on board as well.            Latest Ref Rng & Units 11/30/2024    2:33 PM 05/18/2024    2:23 PM 04/22/2023    1:40 PM  CBC  WBC 4.0 - 10.5 K/uL 5.1  7.3  5.9   Hemoglobin 12.0 - 15.0 g/dL 85.6  86.5  86.5   Hematocrit 36.0 - 46.0 % 42.6  41.4  40.9   Platelets 150.0 - 400.0 K/uL 231.0  239  245.0       Past Medical History:  Diagnosis Date   Allergy    Anemia    Anxiety    Asthma, chronic 02/05/2016   Hand, foot and mouth disease    has had in  past   Sunburn 05/30/2012   WCC (well child check) 05/30/2012    Past Surgical History:  Procedure Laterality Date   ENDOSCOPIC INJECTION OF DEFLUX  24 yrs old   [place]    Family History  Problem Relation Age of Onset   Hyperlipidemia Mother    Cancer Maternal Grandfather 27       lung, brain, abdomen   Hypertension Maternal Grandfather     Social History   Socioeconomic History   Marital status: Single    Spouse name: Not on file   Number of children: Not on file   Years of education: Not on file   Highest education level: Some college, no degree  Occupational History   Not on file  Tobacco Use    Smoking status: Never   Smokeless tobacco: Never  Vaping Use   Vaping status: Never Used  Substance and Sexual Activity   Alcohol use: Yes    Alcohol/week: 2.0 - 3.0 standard drinks of alcohol    Types: 2 - 3 Glasses of wine per week   Drug use: No   Sexual activity: Yes    Birth control/protection: Condom  Other Topics Concern   Not on file  Social History Narrative   Not on file   Social Drivers of Health   Tobacco Use: Low Risk (12/10/2024)   Patient History    Smoking Tobacco Use: Never    Smokeless Tobacco Use: Never    Passive Exposure: Not on file  Financial Resource Strain: Low Risk (11/16/2024)   Overall Financial Resource Strain (CARDIA)    Difficulty of Paying Living Expenses: Not very hard  Food Insecurity: Low Risk (12/01/2024)   Received from [facility]   Epic    Within the past 12 months, you worried that your food would run out before you got money to buy more: Never true    Within the past 12 months, the food you bought just didn't last and you didn't have money to get more. : Never true  Transportation Needs: No Transportation Needs (12/01/2024)   Received from [facility]    In the past 12 months, has lack of reliable transportation kept you from medical appointments, meetings, work or from getting things needed for daily living? : No  Physical Activity: Sufficiently Active (11/16/2024)   Exercise Vital Sign    Days of Exercise per Week: 5 days    Minutes of Exercise per Session: 40 min  Stress: Stress Concern Present (11/16/2024)   [employer] of Occupational Health - Occupational Stress Questionnaire    Feeling of Stress: Rather much  Social Connections: Moderately Isolated (11/16/2024)   Social Connection and Isolation Panel    Frequency of Communication with Friends and Family: More than three times a week    Frequency of Social Gatherings with Friends and Family: Once a week    Attends Religious Services: More than 4  times per year    Active Member of [employer] or Organizations: No    Attends Banker Meetings: Not on file    Marital Status: Never married  Intimate Partner Violence: Not on file  Depression (PHQ2-9): Medium Risk (12/10/2024)   Depression (PHQ2-9)    PHQ-2 Score: 9  Alcohol Screen: Low Risk (11/16/2024)   Alcohol Screen    Last Alcohol Screening Score (AUDIT): 3  Housing: Low Risk (12/01/2024)   Received from [facility]   Epic    What  is your living situation today?: I have a steady place to live    Think about the place you live. Do you have problems with any of the following? Choose all that apply:: None/None on this list  Recent Concern: Housing - High Risk (09/08/2024)   Epic    Unable to Pay for Housing in the Last Year: Yes    Number of Times Moved in the Last Year: 0    Homeless in the Last Year: No  Utilities: Low Risk (12/01/2024)   Received from [facility]   Utilities    In the past 12 months has the electric, gas, oil, or water company threatened to shut off services in your home? : No  Health Literacy: Not on file    ROS All ROS negative except what is listed in the HPI.   Objective:   Today's Vitals: BP 133/72   Pulse 77   Ht 5' 9 (1.753 m)   Wt 149 lb (67.6 kg)   SpO2 100%   BMI 22.00 kg/m   Physical Exam Vitals reviewed.  Constitutional:      Appearance: Normal appearance.  Neurological:     Mental Status: She is alert and oriented to person, place, and time.  Psychiatric:        Mood and Affect: Mood normal.        Behavior: Behavior normal.        Thought Content: Thought content normal.               Assessment & Plan:   Problem List Items Addressed This Visit       Active Problems   Iron deficiency anemia   Relevant Orders   CBC with Differential/Platelet   IBC + Ferritin   Other fatigue - Primary   Relevant Orders   CBC with Differential/Platelet   IBC + Ferritin   Vitamin D  (25 hydroxy)   Other Visit  Diagnoses       Rash of foot       Relevant Medications   clobetasol  cream (TEMOVATE ) 0.05 %   Other Relevant Orders   Ambulatory referral to Dermatology        Assessment & Plan Polycystic ovary syndrome with menstrual irregularity and prolonged heavy bleeding Suspected PCOS due to high testosterone  and irregular bleeding. Ultrasound scheduled for confirmation. Provera not started since bleeding stopped on its own yesterday. Family history of adverse effects from birth control noted.  - Await ultrasound results to confirm PCOS diagnosis. - Discuss treatment options with OB/GYN post-ultrasound.   Fatigue Persistent fatigue possibly linked to heavy bleeding. B12 low-normal, iron slightly elevated, hemoglobin 14.3. Vitamin D  deficiency considered. - Ordered hemoglobin and iron levels to assess for anemia since bleeding has finally stopped. - Ordered vitamin D  level to assess for deficiency. Aware that insurance may not cover, but she would like to proceed given severity of her symptoms.    Chronic dermatitis of the foot Chronic foot dermatitis with cracking and bleeding. Clobetasol  effective temporarily. Differential includes fungal infection, eczema, psoriasis, etc. Podiatry appointment scheduled. - Prescribed clobetasol  cream to try again - Attend podiatry appointment on January 12th. - Follow-up with dermatology pending upcoming podiatry appointment.       Follow-up: Return for - pending results or sooner if needed.   Erica FURY Almarie, DNP, FNP-C  I,Erica Novak,acting as a neurosurgeon for Erica KATHEE Almarie, NP.,have documented all relevant documentation on the behalf of Erica KATHEE Almarie, NP.  I, Erica KATHEE Almarie, NP,  have reviewed all documentation for this visit. The documentation on 12/10/2024 for the exam, diagnosis, procedures, and orders are all accurate and complete. "

## 2024-12-11 ENCOUNTER — Ambulatory Visit: Payer: Self-pay | Admitting: Family Medicine

## 2024-12-11 LAB — CBC WITH DIFFERENTIAL/PLATELET
Basophils Absolute: 0.1 K/uL (ref 0.0–0.1)
Basophils Relative: 0.7 % (ref 0.0–3.0)
Eosinophils Absolute: 0.1 K/uL (ref 0.0–0.7)
Eosinophils Relative: 1.1 % (ref 0.0–5.0)
HCT: 39.5 % (ref 36.0–46.0)
Hemoglobin: 13.4 g/dL (ref 12.0–15.0)
Lymphocytes Relative: 24.3 % (ref 12.0–46.0)
Lymphs Abs: 2.1 K/uL (ref 0.7–4.0)
MCHC: 33.8 g/dL (ref 30.0–36.0)
MCV: 92.1 fl (ref 78.0–100.0)
Monocytes Absolute: 0.8 K/uL (ref 0.1–1.0)
Monocytes Relative: 8.5 % (ref 3.0–12.0)
Neutro Abs: 5.8 K/uL (ref 1.4–7.7)
Neutrophils Relative %: 65.4 % (ref 43.0–77.0)
Platelets: 251 K/uL (ref 150.0–400.0)
RBC: 4.29 Mil/uL (ref 3.87–5.11)
RDW: 13.5 % (ref 11.5–15.5)
WBC: 8.8 K/uL (ref 4.0–10.5)

## 2024-12-11 LAB — IBC + FERRITIN
Ferritin: 12.9 ng/mL (ref 10.0–291.0)
Iron: 55 ug/dL (ref 42–145)
Saturation Ratios: 13.4 % — ABNORMAL LOW (ref 20.0–50.0)
TIBC: 410.2 ug/dL (ref 250.0–450.0)
Transferrin: 293 mg/dL (ref 212.0–360.0)

## 2024-12-11 LAB — VITAMIN D 25 HYDROXY (VIT D DEFICIENCY, FRACTURES): VITD: 28.82 ng/mL — ABNORMAL LOW (ref 30.00–100.00)

## 2024-12-14 ENCOUNTER — Ambulatory Visit: Admitting: Podiatry

## 2024-12-14 ENCOUNTER — Ambulatory Visit: Admitting: Family Medicine

## 2024-12-21 ENCOUNTER — Ambulatory Visit: Admitting: Family Medicine

## 2025-05-19 ENCOUNTER — Encounter: Admitting: Family Medicine

## 2025-09-06 ENCOUNTER — Ambulatory Visit: Admitting: Physician Assistant
